# Patient Record
Sex: Female | Born: 1975 | Race: White | Hispanic: No | Marital: Single | Smoking: Current every day smoker
Health system: Southern US, Community
[De-identification: ages and names within clinical notes are randomized; demographics above are authoritative.]

## PROBLEM LIST (undated history)

## (undated) DIAGNOSIS — K769 Liver disease, unspecified: Secondary | ICD-10-CM

## (undated) DIAGNOSIS — F1910 Other psychoactive substance abuse, uncomplicated: Secondary | ICD-10-CM

## (undated) DIAGNOSIS — Z720 Tobacco use: Secondary | ICD-10-CM

## (undated) DIAGNOSIS — M419 Scoliosis, unspecified: Secondary | ICD-10-CM

## (undated) DIAGNOSIS — B1920 Unspecified viral hepatitis C without hepatic coma: Secondary | ICD-10-CM

---

## 2013-05-27 ENCOUNTER — Emergency Department (HOSPITAL_COMMUNITY): Payer: Medicaid Other

## 2013-05-27 ENCOUNTER — Inpatient Hospital Stay (HOSPITAL_COMMUNITY)
Admission: EM | Admit: 2013-05-27 | Discharge: 2013-05-31 | DRG: 442 | Disposition: A | Discharge status: Home / Self Care | Payer: Medicaid Other | Attending: Internal Medicine | Admitting: Internal Medicine

## 2013-05-27 ENCOUNTER — Encounter (HOSPITAL_COMMUNITY): Payer: Self-pay | Admitting: *Deleted

## 2013-05-27 ENCOUNTER — Inpatient Hospital Stay (HOSPITAL_COMMUNITY): Payer: Medicaid Other

## 2013-05-27 DIAGNOSIS — K7200 Acute and subacute hepatic failure without coma: Secondary | ICD-10-CM

## 2013-05-27 DIAGNOSIS — B179 Acute viral hepatitis, unspecified: Secondary | ICD-10-CM | POA: Diagnosis present

## 2013-05-27 DIAGNOSIS — B1920 Unspecified viral hepatitis C without hepatic coma: Secondary | ICD-10-CM | POA: Diagnosis present

## 2013-05-27 DIAGNOSIS — F1910 Other psychoactive substance abuse, uncomplicated: Secondary | ICD-10-CM | POA: Diagnosis present

## 2013-05-27 DIAGNOSIS — D689 Coagulation defect, unspecified: Secondary | ICD-10-CM | POA: Diagnosis present

## 2013-05-27 DIAGNOSIS — M4120 Other idiopathic scoliosis, site unspecified: Secondary | ICD-10-CM | POA: Diagnosis present

## 2013-05-27 DIAGNOSIS — K5900 Constipation, unspecified: Secondary | ICD-10-CM | POA: Diagnosis present

## 2013-05-27 DIAGNOSIS — F411 Generalized anxiety disorder: Secondary | ICD-10-CM | POA: Diagnosis present

## 2013-05-27 DIAGNOSIS — R17 Unspecified jaundice: Secondary | ICD-10-CM

## 2013-05-27 DIAGNOSIS — F17200 Nicotine dependence, unspecified, uncomplicated: Secondary | ICD-10-CM | POA: Diagnosis present

## 2013-05-27 HISTORY — DX: Tobacco use: Z72.0

## 2013-05-27 HISTORY — DX: Other psychoactive substance abuse, uncomplicated: F19.10

## 2013-05-27 HISTORY — DX: Liver disease, unspecified: K76.9

## 2013-05-27 HISTORY — DX: Scoliosis, unspecified: M41.9

## 2013-05-27 LAB — COMPREHENSIVE METABOLIC PANEL
ALT: 1471 U/L — ABNORMAL HIGH (ref 0–35)
Albumin: 3 g/dL — ABNORMAL LOW (ref 3.5–5.2)
BUN: 7 mg/dL (ref 6–23)
Calcium: 9.3 mg/dL (ref 8.4–10.5)
GFR calc Af Amer: >90 mL/min (ref >90)
Glucose, Bld: 86 mg/dL (ref 70–99)
Potassium: 3.2 mEq/L — ABNORMAL LOW (ref 3.5–5.1)
Sodium: 137 mEq/L (ref 135–145)
Total Protein: 5.8 g/dL — ABNORMAL LOW (ref 6.0–8.3)

## 2013-05-27 LAB — RAPID URINE DRUG SCREEN, HOSP PERFORMED
Cocaine: POSITIVE — AB
Opiates: POSITIVE — AB

## 2013-05-27 LAB — CBC WITH DIFFERENTIAL/PLATELET
Basophils Relative: 1 % (ref 0–1)
Eosinophils Absolute: 0.3 10*3/uL (ref 0.0–0.7)
Eosinophils Relative: 5 % (ref 0–5)
Lymphs Abs: 1.8 10*3/uL (ref 0.7–4.0)
MCH: 29.1 pg (ref 26.0–34.0)
MCHC: 36 g/dL (ref 30.0–36.0)
MCV: 80.9 fL (ref 78.0–100.0)
Neutrophils Relative %: 57 % (ref 43–77)
Platelets: 288 10*3/uL (ref 150–400)
RBC: 4.91 MIL/uL (ref 3.87–5.11)

## 2013-05-27 LAB — URINALYSIS, ROUTINE W REFLEX MICROSCOPIC
Glucose, UA: NEGATIVE mg/dL
Nitrite: POSITIVE — AB
Specific Gravity, Urine: 1.019 (ref 1.005–1.030)
pH: 5.5 (ref 5.0–8.0)

## 2013-05-27 LAB — PHOSPHORUS: Phosphorus: 3.2 mg/dL (ref 2.3–4.6)

## 2013-05-27 LAB — POCT PREGNANCY, URINE: Preg Test, Ur: NEGATIVE

## 2013-05-27 LAB — URINE MICROSCOPIC-ADD ON

## 2013-05-27 LAB — ETHANOL: Alcohol, Ethyl (B): <11 mg/dL (ref 0–11)

## 2013-05-27 LAB — APTT: aPTT: 44 seconds — ABNORMAL HIGH (ref 24–37)

## 2013-05-27 LAB — ACETAMINOPHEN LEVEL: Acetaminophen (Tylenol), Serum: <10 ug/mL — ABNORMAL LOW (ref 10–30)

## 2013-05-27 LAB — RAPID HIV SCREEN (WH-MAU): Rapid HIV Screen: NONREACTIVE

## 2013-05-27 MED ORDER — POTASSIUM CHLORIDE CRYS ER 20 MEQ PO TBCR
40.0000 meq | EXTENDED_RELEASE_TABLET | Freq: Once | ORAL | Status: AC
  Administered 2013-05-27: 40 meq via ORAL
  Filled 2013-05-27: qty 2

## 2013-05-27 MED ORDER — MORPHINE SULFATE 4 MG/ML IJ SOLN: 4.0000 mg | Freq: Once | INTRAMUSCULAR | Status: DC

## 2013-05-27 MED ORDER — ONDANSETRON HCL 4 MG PO TABS: 4.0000 mg | ORAL_TABLET | Freq: Four times a day (QID) | ORAL | Status: DC | PRN

## 2013-05-27 MED ORDER — IOHEXOL 300 MG/ML  SOLN
80.0000 mL | Freq: Once | INTRAMUSCULAR | Status: AC | PRN
  Administered 2013-05-27: 80 mL via INTRAVENOUS

## 2013-05-27 MED ORDER — HEPARIN SODIUM (PORCINE) 5000 UNIT/ML IJ SOLN
5000.0000 [IU] | Freq: Three times a day (TID) | INTRAMUSCULAR | Status: DC
  Administered 2013-05-27 – 2013-05-31 (×10): 5000 [IU] via SUBCUTANEOUS
  Filled 2013-05-27 (×16): qty 1

## 2013-05-27 MED ORDER — SODIUM CHLORIDE 0.9 % IV BOLUS (SEPSIS)
1000.0000 mL | Freq: Once | INTRAVENOUS | Status: AC
  Administered 2013-05-27: 1000 mL via INTRAVENOUS

## 2013-05-27 MED ORDER — DOCUSATE SODIUM 100 MG PO CAPS
100.0000 mg | ORAL_CAPSULE | Freq: Two times a day (BID) | ORAL | Status: DC
  Administered 2013-05-27 – 2013-05-31 (×9): 100 mg via ORAL
  Filled 2013-05-27 (×9): qty 1

## 2013-05-27 MED ORDER — ALPRAZOLAM 0.25 MG PO TABS
0.2500 mg | ORAL_TABLET | Freq: Three times a day (TID) | ORAL | Status: DC | PRN
  Administered 2013-05-28 – 2013-05-30 (×5): 0.25 mg via ORAL
  Filled 2013-05-27 (×5): qty 1

## 2013-05-27 MED ORDER — IOHEXOL 300 MG/ML  SOLN
25.0000 mL | INTRAMUSCULAR | Status: AC
  Administered 2013-05-27: 25 mL via ORAL

## 2013-05-27 MED ORDER — DESOGESTREL-ETHINYL ESTRADIOL 0.15-0.02/0.01 MG (21/5) PO TABS: 1.0000 | ORAL_TABLET | Freq: Every day | ORAL | Status: DC

## 2013-05-27 MED ORDER — OXYCODONE HCL 5 MG PO TABS
5.0000 mg | ORAL_TABLET | ORAL | Status: DC | PRN
  Administered 2013-05-27 – 2013-05-31 (×15): 5 mg via ORAL
  Filled 2013-05-27 (×16): qty 1

## 2013-05-27 NOTE — ED Provider Notes (Signed)
CSN: [id number]     Arrival date & time 05/27/13  9147 History     First MD Initiated Contact with Patient 05/27/13 [phone]     No chief complaint on file.  (Consider location/radiation/quality/duration/timing/severity/associated sxs/prior Treatment) Patient is a 37 y.o. female presenting with abdominal pain. The history is provided by the patient.  Abdominal Pain Pain location:  Generalized Pain quality: aching   Pain radiates to:  Back Pain severity:  Severe Onset quality:  Gradual Duration:  2 days Timing:  Constant Progression:  Unchanged Chronicity: acute on chronic. Context: eating   Context: not alcohol use, not retching and not sick contacts   Relieved by:  None tried Worsened by:  Eating Ineffective treatments:  None tried Associated symptoms: constipation, nausea and vomiting   Associated symptoms: no anorexia, no chest pain, no chills, no cough, no diarrhea, no dysuria, no fatigue, no fever, no hematemesis, no hematochezia, no hematuria, no melena and no shortness of breath   Nausea:    Severity:  Moderate   Onset quality:  Gradual   Duration:  2 days   Timing:  Constant   Progression:  Unchanged Vomiting:    Quality:  Bilious material   Number of occurrences:  Multiple   Severity:  Moderate   Duration:  2 days   Timing:  Constant   Progression:  Unchanged Risk factors comment:  H/o enlarged liver   Past Medical History  Diagnosis Date  . Liver disease     Diagnosed in 2014, no work-up to date  . Scoliosis     Per patient report, with chronic back pain, on chronic opioids  . Tobacco abuse   . IV drug abuse    Past Surgical History  Procedure Laterality Date  . Cesarean section     History reviewed. No pertinent family history. History  Substance Use Topics  . Smoking status: Current Every Day Smoker -- 1.00 packs/day for 25 years    Types: Cigarettes  . Smokeless tobacco: Never Used  . Alcohol Use: No     Comment: History of binge drinking, quit  in 2002 when she became pregnant   OB History   Grav Para Term Preterm Abortions TAB SAB Ect Mult Living                 Review of Systems  Constitutional: Negative for fever, chills, diaphoresis, appetite change and fatigue.  Eyes: Negative for photophobia and visual disturbance.  Respiratory: Negative for cough, chest tightness, shortness of breath and wheezing.   Cardiovascular: Negative for chest pain, palpitations and leg swelling.  Gastrointestinal: Positive for nausea, vomiting, abdominal pain and constipation. Negative for diarrhea, blood in stool, melena, hematochezia, abdominal distention, anorexia and hematemesis.  Genitourinary: Negative for dysuria, urgency, frequency, hematuria, flank pain, decreased urine volume and difficulty urinating.  Skin: Positive for color change (jaundice).  Neurological: Negative for dizziness, syncope, weakness, light-headedness, numbness and headaches.  All other systems reviewed and are negative.    Allergies  Review of patient's allergies indicates no known allergies.  Home Medications  No current outpatient prescriptions on file. BP 107/81  Pulse 63  Temp(Src) 98.1 F (36.7 C) (Oral)  Resp 16  SpO2 100%  LMP 05/20/2013 Physical Exam  Nursing note and vitals reviewed. Constitutional: She is oriented to person, place, and time. She appears well-developed and well-nourished. No distress.  HENT:  Head: Normocephalic and atraumatic.  Eyes: EOM are normal. Pupils are equal, round, and reactive to light. Scleral icterus is present.  Neck: Normal range of motion. Neck supple.  Cardiovascular: Normal rate, regular rhythm, normal heart sounds and intact distal pulses.   Pulmonary/Chest: Effort normal and breath sounds normal. No respiratory distress. She has no wheezes. She has no rales.  Abdominal: Soft. Normal appearance and bowel sounds are normal. She exhibits no distension, no fluid wave, no ascites and no mass. There is generalized  tenderness. There is no rigidity, no rebound, no guarding, no CVA tenderness, no tenderness at McBurney's point and negative Murphy's sign.  Musculoskeletal: Normal range of motion. She exhibits no edema and no tenderness.  Neurological: She is alert and oriented to person, place, and time. She exhibits normal muscle tone. Coordination normal.  Skin: Skin is warm and dry. She is not diaphoretic.  Jaundice    ED Course   Procedures (including critical care time)  Labs Reviewed  CBC WITH DIFFERENTIAL - Abnormal; Notable for the following:    RDW 17.8 (*)    All other components within normal limits  COMPREHENSIVE METABOLIC PANEL - Abnormal; Notable for the following:    Potassium 3.2 (*)    Total Protein 5.8 (*)    Albumin 3.0 (*)    AST 2148 (*)    ALT 1471 (*)    Alkaline Phosphatase 254 (*)    Total Bilirubin 32.0 (*)    All other components within normal limits  URINALYSIS, ROUTINE W REFLEX MICROSCOPIC - Abnormal; Notable for the following:    Color, Urine AMBER (*)    APPearance CLOUDY (*)    Bilirubin Urine LARGE (*)    Ketones, ur 15 (*)    Nitrite POSITIVE (*)    Leukocytes, UA SMALL (*)    All other components within normal limits  APTT - Abnormal; Notable for the following:    aPTT 44 (*)    All other components within normal limits  URINE RAPID DRUG SCREEN (HOSP PERFORMED) - Abnormal; Notable for the following:    Opiates POSITIVE (*)    Cocaine POSITIVE (*)    Benzodiazepines POSITIVE (*)    Tetrahydrocannabinol POSITIVE (*)    All other components within normal limits  PROTIME-INR - Abnormal; Notable for the following:    Prothrombin Time 18.0 (*)    INR 1.53 (*)    All other components within normal limits  URINE MICROSCOPIC-ADD ON - Abnormal; Notable for the following:    Squamous Epithelial / LPF FEW (*)    Bacteria, UA MANY (*)    Casts GRANULAR CAST (*)    All other components within normal limits  HEPATIC FUNCTION PANEL - Abnormal; Notable for the  following:    Total Protein 5.2 (*)    Albumin 2.6 (*)    AST 1926 (*)    ALT 1286 (*)    Alkaline Phosphatase 227 (*)    Total Bilirubin 27.8 (*)    Bilirubin, Direct 18.5 (*)    Indirect Bilirubin 9.3 (*)    All other components within normal limits  URINE CULTURE  LIPASE, BLOOD  AMMONIA  ETHANOL  RAPID HIV SCREEN ([facility])  MAGNESIUM  PHOSPHORUS  HEPATITIS PANEL, ACUTE  ACETAMINOPHEN LEVEL  POCT PREGNANCY, URINE   Ct Abdomen Pelvis W Contrast  05/27/2013   *RADIOLOGY REPORT*  Clinical Data: Jaundiced.  Diffuse abdominal pain with vomiting.  CT ABDOMEN AND PELVIS WITH CONTRAST  Technique:  Multidetector CT imaging of the abdomen and pelvis was performed following the standard protocol during bolus administration of intravenous contrast.  Contrast: 80mL OMNIPAQUE IOHEXOL 300  MG/ML  SOLN  Comparison: CT and ultrasound 01/22/2013.  Findings: Lung bases show dependent atelectasis bilaterally.  Heart size normal.  No pericardial or pleural fluid.  Liver is heterogeneous, as before.  Periportal edema.  Gallbladder wall is thickened with prominent vessels surrounding it, similar to the prior exam.  Extrahepatic bile duct is not dilated, measuring approximately 5 mm.  Adrenal glands, kidneys, spleen, pancreas, stomach and bowel are unremarkable.  Ascites.  No free air.  There may be a borderline enlarged 10 mm gastrohepatic ligament lymph node.  Porta hepatis lymph node also appears prominent, measuring 1.3 cm.  No worrisome lytic or sclerotic lesions. 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  IMPRESSION:  1.  Diffuse heterogeneity of the liver parenchyma and periportal edema, similar to 01/22/2013. 2.  Marked gallbladder wall thickening, as on the prior exam. 3.  New ascites.   Original Report Authenticated By: Leanna Battles, M.D.   Dg Abd Acute W/chest  05/27/2013   *RADIOLOGY REPORT*  Clinical Data: Liver disease, abdominal pain.  ACUTE ABDOMEN  SERIES (ABDOMEN 2 VIEW & CHEST 1 VIEW)  Comparison: 12/15/2012.  Findings: Trachea is midline.  Heart size normal.  Nipple shadows project over the lower hemithoraces.  Lungs are clear.  Question trace left pleural effusion.  Two views of the abdomen show gas and stool in the colon.  No small bowel dilatation.  There may be a few scattered air fluid levels.  IMPRESSION:  1.  Bowel gas pattern is somewhat nonspecific.  Constipation is queried. 2.  Suspect tiny left pleural effusion.   Original Report Authenticated By: Leanna Battles, M.D.   1. Jaundice   2. Acute liver failure   3. Polysubstance abuse     MDM  37 y.o. F with a PMH of "enlarged liver" presenting with abdominal pain.  Pt reports diffuse abdominal pain which has gotten acutely worse over past 2 days.  She states she has developed jaundice of her eyes which is new for her.  She reports associated vomiting which occurs following eating.  States vomitus is bilious.  Pt denies fevers.  No diarrhea but does report constipation recently with BMs every 2-3 days.  No dysuria but she reports orange-colored urine.  Pt states she believes her liver disease is 2/2 tylenol use. She was told at 2 different EDs that she has an enlarged liver but has not followed up to get this further evaluated. She denies alcohol abuse.  She does endorse IV drug abuse over the past month due to pain.  On exam, pt afebrile, VSS.  Pt jaundiced with scleral icterus.  Abdomen soft, diffusely TTP, no rebound or guarding.  No fluid wave or ascites on exam.  Will check basic labs, coags, HIV and hepatitis panel, ammonia, lipase, U/A.  Acute abdominal series ordered to r/o SBO.  Evidence of acute liver failure with AST of 2148, ALT of 1471, tBili of 32.  Coagulopathy present with INR of 1.58.  Plan for admission for further GI workup.  Discussed with attending Dr. Lynelle Doctor.  Jodean Lima, MD 05/27/13 [phone]

## 2013-05-27 NOTE — ED Notes (Signed)
Dr notified of lab results

## 2013-05-27 NOTE — ED Notes (Signed)
Pt up in room, going through bags, slowly, speech slurred, when asked about drug use-- admits to injecting OPANA last week-- has old track marks on right antecubital.

## 2013-05-27 NOTE — ED Notes (Signed)
States "I don't want anyone to know anything -- if my parents come in-- do not tell them anything. I am a single parent, have two kids-- a 79 and 38 yr old. They are staying with my parents" also asking "No one is going to call social services are they? I am a good mom" --

## 2013-05-27 NOTE — ED Notes (Signed)
Sitting in bed, cross legged, coloring with gel pens, talking on phone,

## 2013-05-27 NOTE — ED Provider Notes (Signed)
I saw and evaluated the patient, reviewed the resident's note and I agree with the findings and plan.  Patient presents with worsening abdominal pain she states it started back in December. Patient has been to 2 different emergency rooms and was told that her liver was enlarged. The patient states that she did not followup with anyone else in the interim. The symptoms have progressed. The patient has now noticed that she's having dark discoloration of her skin in her eyes appear yellow. She continues to have nausea and vomiting as well as pain in her abdomen and raised her back. Patient denies any alcohol use. She does admit to IV drug abuse but that was only in the last month to help control her pain.  On exam patient is thin jaundiced, vital signs are stable.  We will plan on checking laboratory tests including an HIV and hepatitis panel. Patient will likely require abdominal imaging.  Plan on IV fluids, pain medications.  Celene Kras, MD 05/27/13 [phone]

## 2013-05-27 NOTE — ED Notes (Signed)
States has been sitting in car all night in valet parking lot-was too weak to get into the hospital. "A nurse coming into work woke me up and I got out and walked around, got back in, fell asleep again"

## 2013-05-27 NOTE — ED Notes (Signed)
Speech slurred, eyes half open, nodding off in middle conversation.

## 2013-05-27 NOTE — Progress Notes (Signed)
Received patient from ED , arousable, slow to respond, VSS, significant other with the patient during the admission. Plan of care discussed to patient and significant other. No questions verbalized, oriented to room,call light and staff. Bed alarm on

## 2013-05-27 NOTE — Progress Notes (Signed)
MD made aware of the bilirubin level. No new order noted.

## 2013-05-27 NOTE — ED Notes (Signed)
Returned from [employer] -- found up in room, "looking for tampon" -- ambulated to BR with assistance-- instructed to stay in the bed and use call bell.

## 2013-05-27 NOTE — ED Notes (Signed)
Pt has a liver problem, pt appears jaundiced.  Pt is vomiting and having abdominal pain and pain in back

## 2013-05-27 NOTE — ED Notes (Signed)
Pt has markers, word search book, and duct tape at bedside-- "i am going to make something pretty.Marland KitchenMarland Kitchen"

## 2013-05-27 NOTE — H&P (Signed)
Date: 05/27/2013               Patient Name:  Vicki Conrad MRN: [id number]  DOB: 1976/07/04 Age / Sex: 37 y.o., female   PCP: Dr. Drucie Opitz, [place]         Medical Service: Internal Medicine Teaching Service         Attending Physician: Dr. Margarito Liner    First Contact: Dr. Yetta Barre Pager: [phone]  Second Contact: Dr. Clyde Lundborg Pager: [phone]       After Hours (After 5p/  First Contact Pager: [phone]  weekends / holidays): Second Contact Pager: [phone]   Chief Complaint: Jaundice, abd pain  History of Present Illness:  The patient is a 37 yo woman, history of IV drug use, presenting with jaundice and abdominal pain.  The patient notes an 8 month history of abdominal pain, described as a sharp, epigastric pain, which radiates to the rest of her abdomen diffusely.  The pain "comes and goes", and may last for days at a time, though with some pain-free days.  The pain is around its baseline.  Associated symptoms include nausea for the last week, with only 1 episode of non-bloody vomiting (yesterday), and constipation.  The patient also noticed yellowing of her skin for the last 1 week, and scleral icterus for the last 4 days.  To manage the pain, the patient admits to taking oxycodone 30 mg TID, and injecting IV hydromorphone, as well as injecting "anything I can get my hands on", with only some relief.  The patient states she has sought care for this at [facility] and [facility], and was told she had an "enlarged liver", but did not stay for admission.  The patient notes no tylenol use (except as part of a friend's percocet, which she occasionally takes) since 09/2012.  Meds: Current Outpatient Prescriptions  Medication Sig Dispense Refill  . ALPRAZolam (XANAX) 1 MG tablet Take 1 mg by mouth 3 (three) times daily.      Marland Kitchen desogestrel-ethinyl estradiol (KARIVA,AZURETTE,MIRCETTE) 0.15-0.02/0.01 MG (21/5) tablet Take 1 tablet by mouth daily.      Marland Kitchen loratadine (CLARITIN) 10 MG  tablet Take 10 mg by mouth 2 (two) times daily.      Marland Kitchen oxycodone (ROXICODONE) 30 MG immediate release tablet Take 30 mg by mouth 3 (three) times daily as needed for pain.        Allergies: Allergies as of 05/27/2013  . (No Known Allergies)   Past Medical History  Diagnosis Date  . Liver disease     Diagnosed in 2014, no work-up to date  . Scoliosis     Per patient report, with chronic back pain, on chronic opioids  . Tobacco abuse   . IV drug abuse    Past Surgical History  Procedure Laterality Date  . Cesarean section     No family history on file. History   Social History  . Marital Status: Single    Spouse Name: N/A    Number of Children: N/A  . Years of Education: N/A   Occupational History  . Not on file.   Social History Main Topics  . Smoking status: Current Every Day Smoker -- 1.00 packs/day for 25 years  . Smokeless tobacco: Not on file  . Alcohol Use: No     Comment: History of binge drinking, quit in 2002 when she became pregnant  . Drug Use: Yes     Comment: Cocaine, marijuana  . Sexual Activity: Not  on file   Other Topics Concern  . Not on file   Social History Narrative   The patient works as a Engineer, maintenance (IT).  The patient has 2 children, age 41 and 24.    Review of Systems: General: no fevers, chills, changes in weight, changes in appetite Skin: no rash HEENT: no blurry vision, hearing changes, sore throat Pulm: no dyspnea, coughing, wheezing CV: no chest pain, palpitations, shortness of breath Abd: see HPI GU: no dysuria, hematuria, polyuria Ext: no arthralgias, myalgias Neuro: no weakness, numbness, or tingling  Physical Exam: Blood pressure 112/75, pulse 77, temperature 97.5 F (36.4 C), temperature source Oral, resp. rate 18, SpO2 97.00%. General: alert, cooperative, appears sad HEENT: +bilateral scleral icterus, PERRL, EOMI, oropharynx clear and non-erythematous Neck: supple Lungs: clear to ascultation bilaterally, normal work  of respiration, no wheezes, rales, ronchi Heart: regular rate and rhythm, no murmurs, gallops, or rubs Abdomen: flat, diffusely moderately ttp, maximally at RUQ, non-distended, no rebound tenderness, normoactive bowel sounds, examination limited by voluntary guarding Extremities: no cyanosis, clubbing, or edema Neurologic: alert & oriented X3, cranial nerves II-XII intact, strength grossly intact, sensation intact to light touch  Lab results: Basic Metabolic Panel:  Recent Labs  52/84/13 0924  NA 137  K 3.2*  CL 99  CO2 27  GLUCOSE 86  BUN 7  CREATININE 0.81  CALCIUM 9.3   Liver Function Tests:  Recent Labs  05/27/13 0924  AST 2148*  ALT 1471*  ALKPHOS 254*  BILITOT 32.0*  PROT 5.8*  ALBUMIN 3.0*    Recent Labs  05/27/13 0924  LIPASE 26    Recent Labs  05/27/13 0924  AMMONIA UNABLE TO DETERMINE DUE TO ICTERUS   CBC:  Recent Labs  05/27/13 0924  WBC 6.5  NEUTROABS 3.7  HGB 14.3  HCT 39.7  MCV 80.9  PLT 288   Coagulation:  Recent Labs  05/27/13 0924  LABPROT 18.0*  INR 1.53*   Urine Drug Screen: Drugs of Abuse     Component Value Date/Time   LABOPIA POSITIVE* 05/27/2013 1100   COCAINSCRNUR POSITIVE* 05/27/2013 1100   LABBENZ POSITIVE* 05/27/2013 1100   AMPHETMU NONE DETECTED 05/27/2013 1100   THCU POSITIVE* 05/27/2013 1100   LABBARB NONE DETECTED 05/27/2013 1100      Alcohol Level:  Recent Labs  05/27/13 0924  ETH <11   Urine pregnancy test: Negative  Urinalysis:  Recent Labs  05/27/13 1100  COLORURINE AMBER*  LABSPEC 1.019  PHURINE 5.5  GLUCOSEU NEGATIVE  HGBUR NEGATIVE  BILIRUBINUR LARGE*  KETONESUR 15*  PROTEINUR NEGATIVE  UROBILINOGEN 1.0  NITRITE POSITIVE*  LEUKOCYTESUR SMALL*    Imaging results:  Dg Abd Acute W/chest  05/27/2013   *RADIOLOGY REPORT*  Clinical Data: Liver disease, abdominal pain.  ACUTE ABDOMEN SERIES (ABDOMEN 2 VIEW & CHEST 1 VIEW)  Comparison: 12/15/2012.  Findings: Trachea is midline.  Heart  size normal.  Nipple shadows project over the lower hemithoraces.  Lungs are clear.  Question trace left pleural effusion.  Two views of the abdomen show gas and stool in the colon.  No small bowel dilatation.  There may be a few scattered air fluid levels.  IMPRESSION:  1.  Bowel gas pattern is somewhat nonspecific.  Constipation is queried. 2.  Suspect tiny left pleural effusion.   Original Report Authenticated By: Leanna Battles, M.D.    Assessment & Plan by Problem: The patient is a 37 yo woman, history of IV drug use, presenting with acute liver failure.  #  Acute Hepatitis - The patient presents with elevated AST, ALT, alk phos, and bilirubin, concerning for acute liver injury.  Given history of IV drug use, I'm concerned about acute viral hepatitis.  Other possibilities include autoimmune hepatitis (though no history of autoimmune disease) vs acetaminophen overdose (pt denies tylenol usage) vs alcoholic hepatitis (though pt denies recent alcohol use).  Less likely possibilities include liver mass vs cholecystitis vs PBC.  Pt may also have a component of chronic liver disease, given prior alcohol use. -checking HIV, hepatitis panel, tylenol level -ordered CT abd/pelvis -if above studies unrevealing, can consider work-up for more rare disease (autoimmune, Wilson's disease, etc.)  -avoid hepatotoxic medications -trend LFT's -will attempt to obtain outside records for comparison -will give oxycodone 5 mg q4prn for pain.  Will need to be cautious with pain medications, due to acute liver injury  # Anxiety -will decrease xanax to 0.25 mg TID prn, given acute liver injury  # Prophy - heparin  Dispo: Disposition is deferred at this time, awaiting improvement of current medical problems. Anticipated discharge in approximately 2-3 day(s).   The patient does have a current PCP (Dr. Drucie Opitz) and does not need an [facility] hospital follow-up appointment after discharge.  Signed: Linward Headland,  MD 05/27/2013, 11:48 AM

## 2013-05-28 LAB — HEPATITIS PANEL, ACUTE
HCV Ab: REACTIVE — AB
Hep A IgM: NEGATIVE
Hepatitis B Surface Ag: NEGATIVE

## 2013-05-28 LAB — HEPATIC FUNCTION PANEL
ALT: 1286 U/L — ABNORMAL HIGH (ref 0–35)
ALT: 1075 U/L — ABNORMAL HIGH (ref 0–35)
AST: 1716 U/L — ABNORMAL HIGH (ref 0–37)
Albumin: 2.3 g/dL — ABNORMAL LOW (ref 3.5–5.2)
Bilirubin, Direct: 21.9 mg/dL — ABNORMAL HIGH (ref 0.0–0.3)
Bilirubin, Direct: 19.5 mg/dL — ABNORMAL HIGH (ref 0.0–0.3)
Indirect Bilirubin: 9.3 mg/dL — ABNORMAL HIGH (ref 0.3–0.9)
Total Protein: 5.2 g/dL — ABNORMAL LOW (ref 6.0–8.3)
Total Protein: 4.7 g/dL — ABNORMAL LOW (ref 6.0–8.3)

## 2013-05-28 LAB — CBC
MCV: 79 fL (ref 78.0–100.0)
Platelets: 230 10*3/uL (ref 150–400)
RBC: 5.2 MIL/uL — ABNORMAL HIGH (ref 3.87–5.11)
RDW: 17.5 % — ABNORMAL HIGH (ref 11.5–15.5)
WBC: 8.9 10*3/uL (ref 4.0–10.5)

## 2013-05-28 LAB — BASIC METABOLIC PANEL
CO2: 24 mEq/L (ref 19–32)
Calcium: 8.6 mg/dL (ref 8.4–10.5)
Creatinine, Ser: 0.79 mg/dL (ref 0.50–1.10)
GFR calc Af Amer: >90 mL/min (ref >90)
GFR calc non Af Amer: >90 mL/min (ref >90)
Sodium: 139 mEq/L (ref 135–145)

## 2013-05-28 NOTE — Progress Notes (Signed)
Patient ID: Vicki Conrad, female   DOB: 06/21/1976, 37 y.o.   MRN: [id number]   Subjective: [name] is a 37 yo woman with a history of anxiety and IV drug use who presents with acute hepatitis.woman with a history of anxiety and IV drug use who presents with acute hepatitis.  This morning she tells me that her abdominal pain is present although unchanged from yesterday. She also states that she has not had a bowel movement in over a week. She states that she has not tried a laxative before. Back pain is at baseline. No dysuria.   At 14:40 we were informed that Vicki Conrad left the hospital and was found in the parking lot with a friend. She then returned to her room.  Objective: Vital signs in last 24 hours: Filed Vitals:   05/27/13 2113 05/28/13 0140 05/28/13 0604 05/28/13 1007  BP: 103/63 97/61 102/63 113/70  Pulse: 65 70 79 65  Temp: 98 F (36.7 C) 98.4 F (36.9 C) 98.3 F (36.8 C) 98.1 F (36.7 C)  TempSrc: Oral Oral Oral Oral  Resp: 17 19 18 16   Height:      Weight:      SpO2: 96% 92% 93% 99%   Weight change:   Intake/Output Summary (Last 24 hours) at 05/28/13 1108 Last data filed at 05/28/13 0610  Gross per 24 hour  Intake      0 ml  Output   1875 ml  Net  -1875 ml   Physical Exam  Constitutional: She appears jaundiced. She appears unhealthy.  tearful  Eyes: Scleral icterus (bilaterally) is present.  Cardiovascular: Normal rate, regular rhythm and normal heart sounds.  Exam reveals no gallop and no friction rub.   No murmur heard. Pulmonary/Chest: Effort normal and breath sounds normal. She has no wheezes. She has no rhonchi.  Abdominal: Bowel sounds are normal. There is tenderness. There is guarding.  Abdominal exam limited by guarding. Unable to assess beyond general tenderness.  Psychiatric: Her mood appears anxious.    Lab Results: CBC    Component Value Date/Time   WBC 8.9 05/28/2013 0500   RBC 5.20* 05/28/2013 0500   HGB 15.2* 05/28/2013 0500   HCT 41.1 05/28/2013 0500   PLT 230 05/28/2013 0500   MCV 79.0 05/28/2013 0500   MCH  29.2 05/28/2013 0500   MCHC 37.0* 05/28/2013 0500   RDW 17.5* 05/28/2013 0500   LYMPHSABS 1.8 05/27/2013 0924   MONOABS 0.6 05/27/2013 0924   EOSABS 0.3 05/27/2013 0924   BASOSABS 0.1 05/27/2013 0924    CMP     Component Value Date/Time   NA 139 05/28/2013 0500   K 3.9 05/28/2013 0500   CL 105 05/28/2013 0500   CO2 24 05/28/2013 0500   GLUCOSE 85 05/28/2013 0500   BUN 5* 05/28/2013 0500   CREATININE 0.79 05/28/2013 0500   CALCIUM 8.6 05/28/2013 0500   PROT 4.7* 05/28/2013 0500   ALBUMIN 2.3* 05/28/2013 0500   AST 1716* 05/28/2013 0500   ALT 1075* 05/28/2013 0500   ALKPHOS 214* 05/28/2013 0500   BILITOT 25.2* 05/28/2013 0500   GFRNONAA >90 05/28/2013 0500   GFRAA >90 05/28/2013 0500     Micro Results: No results found for this or any previous visit (from the past 240 hour(s)). Studies/Results: Ct Abdomen Pelvis W Contrast  05/27/2013   *RADIOLOGY REPORT*  Clinical Data: Jaundiced.  Diffuse abdominal pain with vomiting.  CT ABDOMEN AND PELVIS WITH CONTRAST  Technique:  Multidetector CT imaging of the abdomen and pelvis was performed following the standard protocol during bolus administration of  intravenous contrast.  Contrast: 80mL OMNIPAQUE IOHEXOL 300 MG/ML  SOLN  Comparison: CT and ultrasound 01/22/2013.  Findings: Lung bases show dependent atelectasis bilaterally.  Heart size normal.  No pericardial or pleural fluid.  Liver is heterogeneous, as before.  Periportal edema.  Gallbladder wall is thickened with prominent vessels surrounding it, similar to the prior exam.  Extrahepatic bile duct is not dilated, measuring approximately 5 mm.  Adrenal glands, kidneys, spleen, pancreas, stomach and bowel are unremarkable.  Ascites.  No free air.  There may be a borderline enlarged 10 mm gastrohepatic ligament lymph node.  Porta hepatis lymph node also appears prominent, measuring 1.3 cm.  No worrisome lytic or sclerotic lesions. 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  IMPRESSION:  1.  Diffuse heterogeneity of the liver parenchyma and periportal edema, similar to 01/22/2013. 2.  Marked gallbladder wall thickening, as on the prior exam. 3.  New ascites.   Original Report Authenticated By: Leanna Battles, M.D.   Dg Abd Acute W/chest  05/27/2013   *RADIOLOGY REPORT*  Clinical Data: Liver disease, abdominal pain.  ACUTE ABDOMEN SERIES (ABDOMEN 2 VIEW & CHEST 1 VIEW)  Comparison: 12/15/2012.  Findings: Trachea is midline.  Heart size normal.  Nipple shadows project over the lower hemithoraces.  Lungs are clear.  Question trace left pleural effusion.  Two views of the abdomen show gas and stool in the colon.  No small bowel dilatation.  There may be a few scattered air fluid levels.  IMPRESSION:  1.  Bowel gas pattern is somewhat nonspecific.  Constipation is queried. 2.  Suspect tiny left pleural effusion.   Original Report Authenticated By: Leanna Battles, M.D.   Medications: I have reviewed the patient's current medications. Scheduled Meds:  docusate sodium  100 mg Oral BID   heparin  5,000 Units Subcutaneous Q8H    morphine injection  4 mg Intravenous Once   Continuous Infusions:  PRN Meds:.ALPRAZolam, ondansetron, oxyCODONE Assessment/Plan: Vicki Conrad is a 37 yo woman with a history of anxiety and IV drug use who presents with acute hepatitis.woman with a history of anxiety and IV drug use who presents with acute hepatitis.  **Acute hepatitis The patient has a reactive antibody test for Hepatitis C. Need to follow up with confirmatory HCV RNA testing and genotype. Negative Hepatitis B surface antigen. Hepatitis A IgM pending. LFTs continue to trend down since admission. [facility] and [facility]. At this time it remains unclear whether the patient is presenting with acute or chronic hepatitis C and if there are additional liver insults from other sources. Her transaminases are more elevated than expected for hepatitis C infection alone. Will work up for additional  etiologies. -HCV RNA test and genotype -[facility] consult -ANA, Anti-Smooth Muscle Ab, Anti-Mitochondrial Ab, serum ceruloplasmin, iron studies -Follow up on OSH records -Trend LFTs -Supportive care -Oxycodone 5 mg q 4 hours prn (must be cautious due to liver injury) **Anxiety -Xanax 0.25 mg TID prn -Consider switching benzodiazepine to one metabolized primarily outside of the liver (e.g. lorazepam, oxazepam, or temazepam) **Constipation -docusate sodium 100 mg BID **UTI She is asymptomatic and not pregnant. Patient specifically denies dysuria.  -Follow up urine culture **DVT PPx -Heparin  This is a Psychologist, occupational Note.  The care of the patient was discussed with Dr. Lauris Chroman and the assessment and plan formulated with their assistance.  Please see their attached note for official documentation of the daily encounter.   LOS: 1 day   Minerva Areola  Rinaldo Cloud, MS3 05/28/2013, 5:16 PM

## 2013-05-28 NOTE — H&P (Addendum)
Internal Medicine Attending Admission Note Date: 05/28/2013  Patient name: Vicki Conrad Medical record number: [id number] Date of birth: Feb 13, 1976 Age: 37 y.o. Gender: female  I saw and evaluated the patient. I reviewed the resident's note and I agree with the resident's findings and plan as documented in the resident's note, with the following additional comments.  Chief Complaint(s): Abdominal pain, jaundice  History - key components related to admission: Patient is a 37 year old woman with a history of IV drug use admitted with an 8 month history of abdominal pain and recent jaundice.  Patient was apparently seen both at [facility] and [facility] earlier this year, but the extent of her workup is unclear.  She denies any recent Tylenol use, and she also denies over-the-counter medications or herbal supplements.  She denies alcohol.   Physical Exam - key components related to admission:  Filed Vitals:   05/28/13 0140 05/28/13 0604 05/28/13 1007 05/28/13 1345  BP: 97/61 102/63 113/70 107/62  Pulse: 70 79 65 64  Temp: 98.4 F (36.9 C) 98.3 F (36.8 C) 98.1 F (36.7 C) 98.2 F (36.8 C)  TempSrc: Oral Oral Oral Oral  Resp: 19 18 16 18   Height:      Weight:      SpO2: 92% 93% 99% 99%    General: Alert, oriented Skin: Jaundiced Lungs: Clear Heart: Regular; no extra sounds or murmurs Abdomen: Bowel sounds present; moderate tenderness, greatest in right upper quadrant Extremities: No edema  Lab results:   Basic Metabolic Panel:  Recent Labs  40/98/11 0924 05/27/13 1316 05/28/13 0500  NA 137  --  139  K 3.2*  --  3.9  CL 99  --  105  CO2 27  --  24  GLUCOSE 86  --  85  BUN 7  --  5*  CREATININE 0.81  --  0.79  CALCIUM 9.3  --  8.6  MG  --  1.8  --   PHOS  --  3.2  --    Liver Function Tests:  Recent Labs  05/27/13 1316 05/28/13 0500  AST 1926* 1716*  ALT 1286* 1075*  ALKPHOS 227* 214*  BILITOT 27.8* 25.2*  PROT 5.2* 4.7*  ALBUMIN 2.6*  2.3*    Recent Labs  05/27/13 0924  LIPASE 26    Recent Labs  05/27/13 0924  AMMONIA UNABLE TO DETERMINE DUE TO ICTERUS   CBC:  Recent Labs  05/27/13 0924 05/28/13 0500  WBC 6.5 8.9  NEUTROABS 3.7  --   HGB 14.3 15.2*  HCT 39.7 41.1  MCV 80.9 79.0  PLT 288 230    Coagulation:  Recent Labs  05/27/13 0924  INR 1.53*   Urinalysis    Component Value Date/Time   COLORURINE AMBER* 05/27/2013 1100   APPEARANCEUR CLOUDY* 05/27/2013 1100   LABSPEC 1.019 05/27/2013 1100   PHURINE 5.5 05/27/2013 1100   GLUCOSEU NEGATIVE 05/27/2013 1100   HGBUR NEGATIVE 05/27/2013 1100   BILIRUBINUR LARGE* 05/27/2013 1100   KETONESUR 15* 05/27/2013 1100   PROTEINUR NEGATIVE 05/27/2013 1100   UROBILINOGEN 1.0 05/27/2013 1100   NITRITE POSITIVE* 05/27/2013 1100   LEUKOCYTESUR SMALL* 05/27/2013 1100     Urine microscopic: WBCs 3-6, RBCs 0-2, squamous epithelial few, bacteria many, granular casts   Drugs of Abuse     Component Value Date/Time   LABOPIA POSITIVE* 05/27/2013 1100   COCAINSCRNUR POSITIVE* 05/27/2013 1100   LABBENZ POSITIVE* 05/27/2013 1100   AMPHETMU NONE DETECTED 05/27/2013 1100   THCU POSITIVE* 05/27/2013  1100   LABBARB NONE DETECTED 05/27/2013 1100      Alcohol Level:  Recent Labs  05/27/13 0924  ETH <11   Hepatitis B Surface Ag  Date Value Range Status  05/27/2013 NEGATIVE  NEGATIVE Final     HCV Ab  Date Value Range Status  05/27/2013 Reactive* NEGATIVE Final     (NOTE)                                                                               This test is for screening purposes only.  Reactive results should be     confirmed by an alternative method.  Suggest HCV Qualitative, PCR,     test code 47829.  Specimens will be stable for reflex testing up to 3     days after collection.     Performed at [facility]     Hep A IgM  Date Value Range Status  05/27/2013 PENDING  NEGATIVE Incomplete     Hep B C IgM  Date Value Range Status  05/27/2013  PENDING  NEGATIVE Incomplete      Acetaminophen (Tylenol), Serum  Date Value Range Status  05/27/2013 <10.0* 10 - 30 ug/mL Final     Performed at [facility]     Imaging results:  Ct Abdomen Pelvis W Contrast  05/27/2013   *RADIOLOGY REPORT*  Clinical Data: Jaundiced.  Diffuse abdominal pain with vomiting.  CT ABDOMEN AND PELVIS WITH CONTRAST  Technique:  Multidetector CT imaging of the abdomen and pelvis was performed following the standard protocol during bolus administration of intravenous contrast.  Contrast: 80mL OMNIPAQUE IOHEXOL 300 MG/ML  SOLN  Comparison: CT and ultrasound 01/22/2013.  Findings: Lung bases show dependent atelectasis bilaterally.  Heart size normal.  No pericardial or pleural fluid.  Liver is heterogeneous, as before.  Periportal edema.  Gallbladder wall is thickened with prominent vessels surrounding it, similar to the prior exam.  Extrahepatic bile duct is not dilated, measuring approximately 5 mm.  Adrenal glands, kidneys, spleen, pancreas, stomach and bowel are unremarkable.  Ascites.  No free air.  There may be a borderline enlarged 10 mm gastrohepatic ligament lymph node.  Porta hepatis lymph node also appears prominent, measuring 1.3 cm.  No worrisome lytic or sclerotic lesions. 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  IMPRESSION:  1.  Diffuse heterogeneity of the liver parenchyma and periportal edema, similar to 01/22/2013. 2.  Marked gallbladder wall thickening, as on the prior exam. 3.  New ascites.   Original Report Authenticated By: Leanna Battles, M.D.   Dg Abd Acute W/chest  05/27/2013   *RADIOLOGY REPORT*  Clinical Data: Liver disease, abdominal pain.  ACUTE ABDOMEN SERIES (ABDOMEN 2 VIEW & CHEST 1 VIEW)  Comparison: 12/15/2012.  Findings: Trachea is midline.  Heart size normal.  Nipple shadows project over the lower hemithoraces.  Lungs are clear.  Question trace left pleural effusion.   Two views of the abdomen show gas and stool in the colon.  No small bowel dilatation.  There may be a few scattered air fluid levels.  IMPRESSION:  1.  Bowel gas pattern is somewhat  nonspecific.  Constipation is queried. 2.  Suspect tiny left pleural effusion.   Original Report Authenticated By: Leanna Battles, M.D.     Assessment & Plan by Problem:  1.  Acute hepatitis.  Patient's history suggests a chronic hepatic process given her prior outside workup and prior abdominal CT findings, with acute worsening over past week.  The etiology is unclear; the hepatitis C screen is positive, but the marked transaminase and bilirubin elevation seem atypical for hepatitis C.  The differential includes other viral hepatitis, an autoimmune disorder, or other process; patient denies significant acetaminophen use and this is supported by her nondetectable acetaminophen level.  Plan is await remaining hepatitis serologies; get hepatitis C viral load for confirmation; supportive care; get other labs include ANA, anti-smooth muscle antibodies, anti-microsomal antibodies, immunoglobulin levels, ceruloplasmin, and iron studies; GI consult; request outside records.  2.  Anxiety.  Patient is apparently on chronic Xanax; agree with reducing dose; long-term management of her anxiety is complicated by her polysubstance abuse.  3.  Polysubstance abuse.  Plan is social work consult for counseling; advise rehab.

## 2013-05-28 NOTE — Progress Notes (Signed)
  I have seen and examined the patient myself, and I have reviewed the note by Jerolyn Shin, MS 3 and was present during the interview and physical exam.  Please see my separate H&P for additional findings, assessment, and plan.   Signed: Lars Masson, MD 05/28/2013, 5:42 PM

## 2013-05-28 NOTE — Progress Notes (Addendum)
Patient in the hall looking for her nurse and saw this writer saying I want to talk to you and the MD . You have violated the [employer] policy. Walked with the patient in the room and patient very upset and stated the we have violated HIPAA , took her pills ans sent to the pharmacy.Ask the patient to calm down and this writer asked the patient about her statement and stated that her friend told her that she is here because of her polysubstance abuse use. Patient stated " The little [place] man had violated HIPAA, I want to talk to the MD, the president or whoever , I will call my attorney and I am ready to turn this place upside down" Supervisor made aware, MD made aware of the behavior. No paper noted seen on patients chart regarding her claim of a pill sent to the pharmacy.

## 2013-05-28 NOTE — Progress Notes (Addendum)
Patient earlier noted had 2 visitors in the room, patient very alert and moving around the room, when patients visitor left, this writer rounded and patient sleeping , cannot barely open eyes, holding phone on her ears.Supervisor made aware, MD on call made aware as well.

## 2013-05-28 NOTE — Progress Notes (Signed)
Subjective: [name] is a 37 y.o. female w/ PMHx of of IV drug use, presenting with jaundice and abdominal pain.   Seen this AM at bedside. Still complaining of significant diffuse abdominal pain, not controlled by her pain medications. Tearful during interview because her current state of health is difficult for her. She denies any recent nausea, vomiting, fever, chills, SOB, chest pain, or diarrhea. Claims she has not had a bowel movement in some time. Still with significant jaundice and scleral icterus.  Hepatitis panel today showed HCV Ab +ve. Hep A and B negative.   Objective: Vital signs in last 24 hours: Filed Vitals:   05/28/13 0140 05/28/13 0604 05/28/13 1007 05/28/13 1345  BP: 97/61 102/63 113/70 107/62  Pulse: 70 79 65 64  Temp: 98.4 F (36.9 C) 98.3 F (36.8 C) 98.1 F (36.7 C) 98.2 F (36.8 C)  TempSrc: Oral Oral Oral Oral  Resp: 19 18 16 18   Height:      Weight:      SpO2: 92% 93% 99% 99%   Weight change:   Intake/Output Summary (Last 24 hours) at 05/28/13 1752 Last data filed at 05/28/13 0610  Gross per 24 hour  Intake      0 ml  Output   1675 ml  Net  -1675 ml   Physical Exam: General: Alert, cooperative, and in mild distress d/t pain. Tearful during exam. HEENT: Vision grossly intact, oropharynx clear and non-erythematous. Significant scleral icterus. Neck: Full range of motion without pain, supple, no lymphadenopathy or carotid bruits Lungs: Clear to ascultation bilaterally, normal work of respiration, no wheezes, rales, ronchi Heart: Regular rate and rhythm, no murmurs, gallops, or rubs Abdomen: Soft, tender, non-distended. Bowel sounds present. Significant guarding on palpation w/ tenderness diffusely to light touch. Not able to palpate liver, d/t significant guarding. Extremities: No cyanosis, clubbing, or edema Neurologic: Alert & oriented X3, cranial nerves II-XII intact, strength grossly intact, sensation intact to light touch  Lab  Results: Basic Metabolic Panel:  Recent Labs Lab 05/27/13 0924 05/27/13 1316 05/28/13 0500  NA 137  --  139  K 3.2*  --  3.9  CL 99  --  105  CO2 27  --  24  GLUCOSE 86  --  85  BUN 7  --  5*  CREATININE 0.81  --  0.79  CALCIUM 9.3  --  8.6  MG  --  1.8  --   PHOS  --  3.2  --    Liver Function Tests:  Recent Labs Lab 05/27/13 1316 05/28/13 0500  AST 1926* 1716*  ALT 1286* 1075*  ALKPHOS 227* 214*  BILITOT 27.8* 25.2*  PROT 5.2* 4.7*  ALBUMIN 2.6* 2.3*    Recent Labs Lab 05/27/13 0924  LIPASE 26    Recent Labs Lab 05/27/13 0924  AMMONIA UNABLE TO DETERMINE DUE TO ICTERUS   CBC:  Recent Labs Lab 05/27/13 0924 05/28/13 0500  WBC 6.5 8.9  NEUTROABS 3.7  --   HGB 14.3 15.2*  HCT 39.7 41.1  MCV 80.9 79.0  PLT 288 230   Coagulation:  Recent Labs Lab 05/27/13 0924  LABPROT 18.0*  INR 1.53*   Anemia Panel: No results found for this basename: VITAMINB12, FOLATE, FERRITIN, TIBC, IRON, RETICCTPCT,  in the last 168 hours Urine Drug Screen: Drugs of Abuse     Component Value Date/Time   LABOPIA POSITIVE* 05/27/2013 1100   COCAINSCRNUR POSITIVE* 05/27/2013 1100   LABBENZ POSITIVE* 05/27/2013 1100   AMPHETMU  NONE DETECTED 05/27/2013 1100   THCU POSITIVE* 05/27/2013 1100   LABBARB NONE DETECTED 05/27/2013 1100    Alcohol Level:  Recent Labs Lab 05/27/13 0924  ETH <11   Urinalysis:  Recent Labs Lab 05/27/13 1100  COLORURINE AMBER*  LABSPEC 1.019  PHURINE 5.5  GLUCOSEU NEGATIVE  HGBUR NEGATIVE  BILIRUBINUR LARGE*  KETONESUR 15*  PROTEINUR NEGATIVE  UROBILINOGEN 1.0  NITRITE POSITIVE*  LEUKOCYTESUR SMALL*   Micro Results: Recent Results (from the past 240 hour(s))  URINE CULTURE     Status: None   Collection Time    05/27/13 11:00 AM      Result Value Range Status   Specimen Description URINE, CLEAN CATCH   Final   Special Requests NONE   Final   Culture  Setup Time     Final   Value: 05/27/2013 12:12     Performed at [facility] Count     Final   Value: >=100,000 COLONIES/ML     Performed at [facility]   Culture     Final   Value: ESCHERICHIA COLI     Performed at [facility]   Report Status PENDING   Incomplete   Studies/Results: Ct Abdomen Pelvis W Contrast  05/27/2013   *RADIOLOGY REPORT*  Clinical Data: Jaundiced.  Diffuse abdominal pain with vomiting.  CT ABDOMEN AND PELVIS WITH CONTRAST  Technique:  Multidetector CT imaging of the abdomen and pelvis was performed following the standard protocol during bolus administration of intravenous contrast.  Contrast: 80mL OMNIPAQUE IOHEXOL 300 MG/ML  SOLN  Comparison: CT and ultrasound 01/22/2013.  Findings: Lung bases show dependent atelectasis bilaterally.  Heart size normal.  No pericardial or pleural fluid.  Liver is heterogeneous, as before.  Periportal edema.  Gallbladder wall is thickened with prominent vessels surrounding it, similar to the prior exam.  Extrahepatic bile duct is not dilated, measuring approximately 5 mm.  Adrenal glands, kidneys, spleen, pancreas, stomach and bowel are unremarkable.  Ascites.  No free air.  There may be a borderline enlarged 10 mm gastrohepatic ligament lymph node.  Porta hepatis lymph node also appears prominent, measuring 1.3 cm.  No worrisome lytic or sclerotic lesions. 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  IMPRESSION:  1.  Diffuse heterogeneity of the liver parenchyma and periportal edema, similar to 01/22/2013. 2.  Marked gallbladder wall thickening, as on the prior exam. 3.  New ascites.   Original Report Authenticated By: Leanna Battles, M.D.   Dg Abd Acute W/chest  05/27/2013   *RADIOLOGY REPORT*  Clinical Data: Liver disease, abdominal pain.  ACUTE ABDOMEN SERIES (ABDOMEN 2 VIEW & CHEST 1 VIEW)  Comparison: 12/15/2012.  Findings: Trachea is midline.  Heart size normal.  Nipple shadows project over the lower hemithoraces.  Lungs are clear.   Question trace left pleural effusion.  Two views of the abdomen show gas and stool in the colon.  No small bowel dilatation.  There may be a few scattered air fluid levels.  IMPRESSION:  1.  Bowel gas pattern is somewhat nonspecific.  Constipation is queried. 2.  Suspect tiny left pleural effusion.   Original Report Authenticated By: Leanna Battles, M.D.   Medications: I have reviewed the patient's current medications. Scheduled Meds: . docusate sodium  100 mg Oral BID  . heparin  5,000 Units Subcutaneous Q8H  .  morphine injection  4 mg Intravenous Once   Continuous Infusions:  PRN Meds:.ALPRAZolam,  ondansetron, oxyCODONE  Assessment/Plan:  #Acute Hepatitis-  37 y.o. female w/ PMHx of of IV drug use, presenting with jaundice and abdominal pain.  On admission, AST 2148, ALT 1471, Alk Phos 254, total bili 32. This AM, LFT's were AST 1716, ALT 1075, Alk Phos 214, total bili 25.2. Viral Hepatitis panel +ve for HCV AB. Others negative. Sent for HCV RNA for confirmatory workup. On further research, it is unlikely that a sole Hepatitis C infection can cause such a significant transaminitis.  -GI was consulted for the AM.  -Sent for labs to rule out other concomitant liver pathology; ANA, anti-smooth muscle AB, anti-mitochondrial AB, ceruloplasmin, and Iron panel.  -Sent for health records for other institutions to determine previous health issues. -Still trending LFT's.  -Continue oxycodone prn for pain.  #Anxiety- Continue decreased xanax dose of 0.25 mg TID prn, given acute liver injury   #DVT/PE PPx- heparin  Dispo: Disposition is deferred at this time, awaiting improvement of current medical problems.  Anticipated discharge in approximately 2-3 day(s).   The patient does not have a current PCP (No Pcp Per Patient) and does need an [facility] hospital follow-up appointment after discharge.  The patient does not have transportation limitations that hinder transportation to clinic  appointments.  .Services Needed at time of discharge: Y = Yes, Blank = No PT:   OT:   RN:   Equipment:   Other:     LOS: 1 day   Lars Masson, MD 05/28/2013, 5:52 PM Pager: [phone]

## 2013-05-28 NOTE — Progress Notes (Signed)
Noted patient not in the room, staff stated that a visitor came to visit and asked staff if they could walk out, staff told them that the patient is not allowed to step out of the floor. Security made aware that the patient is not on the unit, Md made aware.

## 2013-05-28 NOTE — Progress Notes (Signed)
Patient back on the floor, Security stated he found them walking in the parking deck. Patient educated that she is not allowed to get out of the unit. Will monitor.

## 2013-05-29 DIAGNOSIS — K7200 Acute and subacute hepatic failure without coma: Principal | ICD-10-CM

## 2013-05-29 DIAGNOSIS — F1910 Other psychoactive substance abuse, uncomplicated: Secondary | ICD-10-CM

## 2013-05-29 LAB — URINE CULTURE

## 2013-05-29 LAB — IRON AND TIBC
Iron: 95 ug/dL (ref 42–135)
Saturation Ratios: 35 % (ref 20–55)
TIBC: 273 ug/dL (ref 250–470)

## 2013-05-29 LAB — CBC
HCT: 39.7 % (ref 36.0–46.0)
Hemoglobin: 13.9 g/dL (ref 12.0–15.0)
MCH: 27.9 pg (ref 26.0–34.0)
MCHC: 35 g/dL (ref 30.0–36.0)
RBC: 4.99 MIL/uL (ref 3.87–5.11)

## 2013-05-29 LAB — HCV RNA QUANT RFLX ULTRA OR GENOTYP: HCV Quantitative: 8485 IU/mL — ABNORMAL HIGH (ref <15)

## 2013-05-29 LAB — COMPREHENSIVE METABOLIC PANEL
ALT: 879 U/L — ABNORMAL HIGH (ref 0–35)
Alkaline Phosphatase: 197 U/L — ABNORMAL HIGH (ref 39–117)
BUN: 6 mg/dL (ref 6–23)
CO2: 27 mEq/L (ref 19–32)
Calcium: 8.4 mg/dL (ref 8.4–10.5)
GFR calc Af Amer: >90 mL/min (ref >90)
GFR calc non Af Amer: >90 mL/min (ref >90)
Glucose, Bld: 87 mg/dL (ref 70–99)
Sodium: 140 mEq/L (ref 135–145)

## 2013-05-29 LAB — PROTIME-INR: Prothrombin Time: 17.4 seconds — ABNORMAL HIGH (ref 11.6–15.2)

## 2013-05-29 MED ORDER — POTASSIUM CHLORIDE CRYS ER 20 MEQ PO TBCR
40.0000 meq | EXTENDED_RELEASE_TABLET | Freq: Once | ORAL | Status: AC
  Administered 2013-05-29: 40 meq via ORAL
  Filled 2013-05-29: qty 2

## 2013-05-29 NOTE — Progress Notes (Signed)
Internal Medicine Attending  Date: 05/29/2013  Patient name: Vicki Conrad Medical record number: [id number] Date of birth: 23-May-1976 Age: 37 y.o. Gender: female  I saw and evaluated the patient, and discussed her care on A.M rounds with housestaff.  See the resident's note by Dr. Yetta Barre for details of clinical findings and plans.

## 2013-05-29 NOTE — Progress Notes (Signed)
Patient ID: Vicki Conrad, female   DOB: 16-Aug-1976, 37 y.o.   MRN: [id number]   Subjective: Vicki Conrad is a 37 yo woman with a history of anxiety and IV drug use who presents with acute hepatitis.  Aside from two visitors, there were no acute events overnight. This morning she was somnolent but otherwise feels the same as yesterday. She states that she had a bowel movement last night.  Objective: Vital signs in last 24 hours: Filed Vitals:   05/28/13 1345 05/28/13 1700 05/28/13 2100 05/29/13 0542  BP: 107/62 110/69 99/67 107/61  Pulse: 64 62 66 80  Temp: 98.2 F (36.8 C) 98 F (36.7 C) 98.3 F (36.8 C) 98.4 F (36.9 C)  TempSrc: Oral Oral Oral Oral  Resp: 18 16 18 18   Height:      Weight:      SpO2: 99% 98% 98% 95%   Weight change:   Intake/Output Summary (Last 24 hours) at 05/29/13 1413 Last data filed at 05/28/13 2100  Gross per 24 hour  Intake      0 ml  Output    400 ml  Net   -400 ml   Physical Exam  Constitutional: She appears jaundiced.  Somnolent, tearful  Eyes: Scleral icterus (bilaterally) is present.  Cardiovascular: Normal rate, regular rhythm and normal heart sounds.  Exam reveals no gallop and no friction rub.   No murmur heard. Pulmonary/Chest: Effort normal and breath sounds normal.  Abdominal: Bowel sounds are normal. She exhibits no distension. There is tenderness (diffusely tender to palpation). There is guarding.  Musculoskeletal:       Right lower leg: She exhibits no edema.       Left lower leg: She exhibits no edema.  Psychiatric: Her mood appears anxious.    Lab Results:  CBC    Component Value Date/Time   WBC 7.4 05/29/2013 0500   RBC 4.99 05/29/2013 0500   HGB 13.9 05/29/2013 0500   HCT 39.7 05/29/2013 0500   PLT 240 05/29/2013 0500   MCV 79.6 05/29/2013 0500   MCH 27.9 05/29/2013 0500   MCHC 35.0 05/29/2013 0500   RDW 18.2* 05/29/2013 0500   LYMPHSABS 1.8 05/27/2013 0924   MONOABS 0.6 05/27/2013 0924   EOSABS 0.3 05/27/2013 0924   BASOSABS 0.1 05/27/2013 0924    CMP     Component Value Date/Time   NA 140 05/29/2013 0500   K 3.4* 05/29/2013 0500   CL 105 05/29/2013 0500   CO2 27 05/29/2013 0500   GLUCOSE 87 05/29/2013 0500   BUN 6 05/29/2013 0500   CREATININE 0.80 05/29/2013 0500   CALCIUM 8.4 05/29/2013 0500   PROT 4.9* 05/29/2013 0500   ALBUMIN 2.3* 05/29/2013 0500   AST 1293* 05/29/2013 0500   ALT 879* 05/29/2013 0500   ALKPHOS 197* 05/29/2013 0500   BILITOT 25.1* 05/29/2013 0500   GFRNONAA >90 05/29/2013 0500   GFRAA >90 05/29/2013 0500   INR    Component Value Date/Time   INR 1.47 05/29/2013 0855   INR 1.53* 05/27/2013 0924   Prothrombin Time:  17.4 (05/29/13) 18.0 (05/27/13)  Iron Studies: Ferritin: 170 Iron: 95 TIBC: 273  ANA: negative  Micro Results: Recent Results (from the past 240 hour(s))  URINE CULTURE     Status: None   Collection Time    05/27/13 11:00 AM      Result Value Range Status   Specimen Description URINE, CLEAN CATCH   Final   Special Requests NONE   Final  Culture  Setup Time     Final   Value: 05/27/2013 12:12     Performed at [facility] Count     Final   Value: >=100,000 COLONIES/ML     Performed at [facility]   Culture     Final   Value: ESCHERICHIA COLI     Performed at [facility]   Report Status 05/29/2013 FINAL   Final   Organism ID, Bacteria ESCHERICHIA COLI   Final   Studies/Results: No results found. Medications: I have reviewed the patient's current medications. Scheduled Meds:  docusate sodium  100 mg Oral BID   heparin  5,000 Units Subcutaneous Q8H    morphine injection  4 mg Intravenous Once   Continuous Infusions:  PRN Meds:.ALPRAZolam, ondansetron, oxyCODONE Assessment/Plan: Principal Problem:   Acute hepatitis Active Problems:   Polysubstance abuse  Vicki Conrad is a 37 yo woman with a history of anxiety and IV drug use who presents with acute hepatitis.   **Acute hepatitis  The patient has a  reactive antibody test for Hepatitis C. Need to follow up with confirmatory HCV RNA testing and genotype. Negative Hepatitis B surface antigen. Hepatitis A IgM pending. LFTs continue to trend down since admission. Medical records have been obtained from [facility] and [facility]. She had elevated AST 133 and ALT 95 and UDS positive for cocaine, benzodiazepines, opiates, oxycodone, THC, and tricyclics at [facility] on 01/21/13. It appears that she left AMA before workup was complete. No transaminitis in March of this year at [facility] where she was seen for gastroenteritis. At this time it remains unclear whether the patient is presenting with acute or chronic hepatitis C and if there are additional liver insults from other sources. Her transaminases are more elevated than expected for hepatitis C infection alone. Will work up for additional etiologies. GI consulted and feel the presentation could be explained by Hepatitis C infection and the abdominal pain caused by liver swelling. Iron studies are normal so hemochromatosis is unlikely. -HCV RNA test and genotype  -Appreciate GI recommendations: follow daily PT as it is a sensitive marker of impending liver failure -Anti-Smooth Muscle Ab, Anti-Mitochondrial Ab, serum ceruloplasmin pending -Follow up on OSH records  -Trend LFTs  -Supportive care  -Oxycodone 5 mg q 4 hours prn (must be cautious due to liver injury)  **Anxiety   -Switch Xanax 0.25 mg TID prn to lorazepam (Ativan) 0.5 mg TID prn to reduce the requirement for liver metabolism -Offer outpatient counseling as a resource **Constipation  -docusate sodium 100 mg BID  **UTI  She is asymptomatic and not pregnant. Patient specifically denies dysuria.  -Follow up urine culture  **DVT PPx  -Heparin   This is a Psychologist, occupational Note.  The care of the patient was discussed with Dr. Lauris Chroman and the assessment and plan formulated with their assistance.   Please see their attached note for official documentation of the daily encounter.   LOS: 2 days   Merrie Roof, MS3 05/29/2013, 3:52 PM

## 2013-05-29 NOTE — Progress Notes (Signed)
Subjective: [name] is a 37 y.o. female w/ PMHx of of IV drug use, presenting with jaundice and abdominal pain.   Seen this AM at bedside. Still with abdominal pain today, but says it is controlled by pain medications. Denies any other issues. Slightly somnolent on exam today.   Hepatitis panel today showed HCV Ab +ve. Hep A and B negative. HCV quant came back at 8485. Working up further as her LFT's are much higher than suspected in pure HCV infection.   Objective: Vital signs in last 24 hours: Filed Vitals:   05/28/13 1345 05/28/13 1700 05/28/13 2100 05/29/13 0542  BP: 107/62 110/69 99/67 107/61  Pulse: 64 62 66 80  Temp: 98.2 F (36.8 C) 98 F (36.7 C) 98.3 F (36.8 C) 98.4 F (36.9 C)  TempSrc: Oral Oral Oral Oral  Resp: 18 16 18 18   Height:      Weight:      SpO2: 99% 98% 98% 95%   Weight change:   Intake/Output Summary (Last 24 hours) at 05/29/13 0729 Last data filed at 05/28/13 2100  Gross per 24 hour  Intake      0 ml  Output    400 ml  Net   -400 ml   Physical Exam: General: Alert, cooperative, and in mild distress d/t pain. Tearful during exam. HEENT: Vision grossly intact, oropharynx clear and non-erythematous. Significant scleral icterus. Neck: Full range of motion without pain, supple, no lymphadenopathy or carotid bruits Lungs: Clear to ascultation bilaterally, normal work of respiration, no wheezes, rales, ronchi Heart: Regular rate and rhythm, no murmurs, gallops, or rubs Abdomen: Soft, tender, non-distended. Bowel sounds present. Significant guarding on palpation w/ tenderness diffusely to light touch. Not able to palpate liver, d/t significant guarding. Less than yesterday, however. Extremities: No cyanosis, clubbing, or edema Neurologic: Alert & oriented X3, cranial nerves II-XII intact, strength grossly intact, sensation intact to light touch  Lab Results: Basic Metabolic Panel:  Recent Labs Lab 05/27/13 0924 05/27/13 1316  05/28/13 0500 05/29/13 0500  NA 137  --  139 140  K 3.2*  --  3.9 3.4*  CL 99  --  105 105  CO2 27  --  24 27  GLUCOSE 86  --  85 87  BUN 7  --  5* 6  CREATININE 0.81  --  0.79 0.80  CALCIUM 9.3  --  8.6 8.4  MG  --  1.8  --   --   PHOS  --  3.2  --   --    Liver Function Tests:  Recent Labs Lab 05/28/13 0500 05/29/13 0500  AST 1716* 1293*  ALT 1075* 879*  ALKPHOS 214* 197*  BILITOT 25.2* 25.1*  PROT 4.7* 4.9*  ALBUMIN 2.3* 2.3*    Recent Labs Lab 05/27/13 0924  LIPASE 26    Recent Labs Lab 05/27/13 0924  AMMONIA UNABLE TO DETERMINE DUE TO ICTERUS   CBC:  Recent Labs Lab 05/27/13 0924 05/28/13 0500 05/29/13 0500  WBC 6.5 8.9 7.4  NEUTROABS 3.7  --   --   HGB 14.3 15.2* 13.9  HCT 39.7 41.1 39.7  MCV 80.9 79.0 79.6  PLT 288 230 240   Coagulation:  Recent Labs Lab 05/27/13 0924  LABPROT 18.0*  INR 1.53*   Anemia Panel:  Recent Labs Lab 05/28/13 1642  FERRITIN 170  TIBC 273  IRON 95   Urine Drug Screen: Drugs of Abuse     Component Value Date/Time   LABOPIA  POSITIVE* 05/27/2013 1100   COCAINSCRNUR POSITIVE* 05/27/2013 1100   LABBENZ POSITIVE* 05/27/2013 1100   AMPHETMU NONE DETECTED 05/27/2013 1100   THCU POSITIVE* 05/27/2013 1100   LABBARB NONE DETECTED 05/27/2013 1100    Alcohol Level:  Recent Labs Lab 05/27/13 0924  ETH <11   Urinalysis:  Recent Labs Lab 05/27/13 1100  COLORURINE AMBER*  LABSPEC 1.019  PHURINE 5.5  GLUCOSEU NEGATIVE  HGBUR NEGATIVE  BILIRUBINUR LARGE*  KETONESUR 15*  PROTEINUR NEGATIVE  UROBILINOGEN 1.0  NITRITE POSITIVE*  LEUKOCYTESUR SMALL*   Micro Results: Recent Results (from the past 240 hour(s))  URINE CULTURE     Status: None   Collection Time    05/27/13 11:00 AM      Result Value Range Status   Specimen Description URINE, CLEAN CATCH   Final   Special Requests NONE   Final   Culture  Setup Time     Final   Value: 05/27/2013 12:12     Performed at [facility] Count      Final   Value: >=100,000 COLONIES/ML     Performed at [facility]   Culture     Final   Value: ESCHERICHIA COLI     Performed at [facility]   Report Status PENDING   Incomplete   Studies/Results: Ct Abdomen Pelvis W Contrast  05/27/2013   *RADIOLOGY REPORT*  Clinical Data: Jaundiced.  Diffuse abdominal pain with vomiting.  CT ABDOMEN AND PELVIS WITH CONTRAST  Technique:  Multidetector CT imaging of the abdomen and pelvis was performed following the standard protocol during bolus administration of intravenous contrast.  Contrast: 80mL OMNIPAQUE IOHEXOL 300 MG/ML  SOLN  Comparison: CT and ultrasound 01/22/2013.  Findings: Lung bases show dependent atelectasis bilaterally.  Heart size normal.  No pericardial or pleural fluid.  Liver is heterogeneous, as before.  Periportal edema.  Gallbladder wall is thickened with prominent vessels surrounding it, similar to the prior exam.  Extrahepatic bile duct is not dilated, measuring approximately 5 mm.  Adrenal glands, kidneys, spleen, pancreas, stomach and bowel are unremarkable.  Ascites.  No free air.  There may be a borderline enlarged 10 mm gastrohepatic ligament lymph node.  Porta hepatis lymph node also appears prominent, measuring 1.3 cm.  No worrisome lytic or sclerotic lesions. 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  IMPRESSION:  1.  Diffuse heterogeneity of the liver parenchyma and periportal edema, similar to 01/22/2013. 2.  Marked gallbladder wall thickening, as on the prior exam. 3.  New ascites.   Original Report Authenticated By: Leanna Battles, M.D.   Dg Abd Acute W/chest  05/27/2013   *RADIOLOGY REPORT*  Clinical Data: Liver disease, abdominal pain.  ACUTE ABDOMEN SERIES (ABDOMEN 2 VIEW & CHEST 1 VIEW)  Comparison: 12/15/2012.  Findings: Trachea is midline.  Heart size normal.  Nipple shadows project over the lower hemithoraces.  Lungs are clear.  Question trace left pleural  effusion.  Two views of the abdomen show gas and stool in the colon.  No small bowel dilatation.  There may be a few scattered air fluid levels.  IMPRESSION:  1.  Bowel gas pattern is somewhat nonspecific.  Constipation is queried. 2.  Suspect tiny left pleural effusion.   Original Report Authenticated By: Leanna Battles, M.D.   Medications: I have reviewed the patient's current medications. Scheduled Meds: . docusate sodium  100 mg Oral BID  . heparin  5,000 Units Subcutaneous  Q8H  .  morphine injection  4 mg Intravenous Once   Continuous Infusions:  PRN Meds:.ALPRAZolam, ondansetron, oxyCODONE  Assessment/Plan:  #Acute Hepatitis-  37 y.o. female w/ PMHx of of IV drug use, presenting with jaundice and abdominal pain.  On admission, AST 2148, ALT 1471, Alk Phos 254, total bili 32. This AM, LFT's were AST 1293, ALT 879, Alk Phos 197, total bili 25.1. Viral Hepatitis panel +ve for HCV AB. HCV quant at 8485. On further research, it is unlikely that a sole Hepatitis C infection can cause such a significant transaminitis.  -GI was consulted for today. Appreciate consult. Sent for labs to rule out other concomitant liver pathology; ANA (negative), anti-smooth muscle AB, anti-mitochondrial AB, ceruloplasmin, and Iron panel (wnl). Others still pending. -Sent for health records for other institutions to determine previous health issues. -Still trending LFT's. Improving. PTT slightly elevated at 17.4, INR 1.47. -Continue oxycodone prn for pain.  #Anxiety- Continue decreased xanax dose of 0.25 mg TID prn, given acute liver injury. Consider switching to Ativan as it is not metabolized by the liver.  #DVT/PE PPx- heparin  Dispo: Disposition is deferred at this time, awaiting improvement of current medical problems.  Anticipated discharge in approximately 2-3 day(s).   The patient does not have a current PCP (No Pcp Per Patient) and does need an [facility] hospital follow-up appointment after discharge.  The  patient does not have transportation limitations that hinder transportation to clinic appointments.  .Services Needed at time of discharge: Y = Yes, Blank = No PT:   OT:   RN:   Equipment:   Other:     LOS: 2 days   Lars Masson, MD 05/29/2013, 7:29 AM Pager: [phone]

## 2013-05-29 NOTE — Progress Notes (Signed)
  I have seen and examined the patient myself, and I have reviewed the note by Jerolyn Shin, MS 3 and was present during the interview and physical exam.  Please see my separate H&P for additional findings, assessment, and plan.   Signed: Lars Masson, MD 05/29/2013, 5:21 PM

## 2013-05-29 NOTE — Consult Note (Signed)
[facility] CONSULT Reason for consult: Acute Hepatitis Referring Physician: Internal Medicine Teaching Service  Vicki Conrad is an 37 y.o. female.  HPI: she has a history of alcohol use in the past denies any alcohol for the past 10 years. She has had persistent abdominal pain for some time. The time frame in this is somewhat vague but it appears to have been for several months. A CT scan was obtained recently which showed diffuse thickening of the liver parenchyma and edema with gallbladder wall thickening and some ascites. There were no dilated ducts seen. The patient has both admitted to and denied taking Tylenol as well as narcotics IV. She told me she'd never used IV drugs until the past month but apparently has told others it is been going on for 8 months. Her drug screen was positive. She told me that she had never been icteric until the past week or so. She denies any known prior history of hepatitis. Her main complaint is abdominal pain. Current labs reveal bilirubin 25.1 down slightly from 32 several days ago transaminases remain in the thousands alkaline phosphatase up slightly. Her studies reveal normal iron panel, platelet count, acetaminophen level. Proton slightly elevated in 18. Hepatitis ANB serology negative Hepatitis C antibody reacted. Test for HIV negative. ANA, smooth muscle in the body, AMA, hepatitis C quantitative RNA all are pending  Past Medical History  Diagnosis Date  . Liver disease     Diagnosed in 2014, no work-up to date  . Scoliosis     Per patient report, with chronic back pain, on chronic opioids  . Tobacco abuse   . IV drug abuse     Past Surgical History  Procedure Laterality Date  . Cesarean section      History reviewed. No pertinent family history.  Social History:  reports that she has been smoking Cigarettes.  She has a 25 pack-year smoking history. She has never used smokeless tobacco. She reports that she uses illicit drugs (Cocaine and  Marijuana). She reports that she does not drink alcohol.  Allergies: No Known Allergies  Medications; . docusate sodium  100 mg Oral BID  . heparin  5,000 Units Subcutaneous Q8H  .  morphine injection  4 mg Intravenous Once   PRN Meds ALPRAZolam, ondansetron, oxyCODONE Results for orders placed during the hospital encounter of 05/27/13 (from the past 48 hour(s))  CBC WITH DIFFERENTIAL     Status: Abnormal   Collection Time    05/27/13  9:24 AM      Result Value Range   WBC 6.5  4.0 - 10.5 K/uL   RBC 4.91  3.87 - 5.11 MIL/uL   Hemoglobin 14.3  12.0 - 15.0 g/dL   HCT 95.6  21.3 - 08.6 %   MCV 80.9  78.0 - 100.0 fL   MCH 29.1  26.0 - 34.0 pg   MCHC 36.0  30.0 - 36.0 g/dL   RDW 57.8 (*) 46.9 - 62.9 %   Platelets 288  150 - 400 K/uL   Neutrophils Relative % 57  43 - 77 %   Neutro Abs 3.7  1.7 - 7.7 K/uL   Lymphocytes Relative 28  12 - 46 %   Lymphs Abs 1.8  0.7 - 4.0 K/uL   Monocytes Relative 9  3 - 12 %   Monocytes Absolute 0.6  0.1 - 1.0 K/uL   Eosinophils Relative 5  0 - 5 %   Eosinophils Absolute 0.3  0.0 - 0.7 K/uL  Basophils Relative 1  0 - 1 %   Basophils Absolute 0.1  0.0 - 0.1 K/uL  COMPREHENSIVE METABOLIC PANEL     Status: Abnormal   Collection Time    05/27/13  9:24 AM      Result Value Range   Sodium 137  135 - 145 mEq/L   Potassium 3.2 (*) 3.5 - 5.1 mEq/L   Chloride 99  96 - 112 mEq/L   CO2 27  19 - 32 mEq/L   Glucose, Bld 86  70 - 99 mg/dL   BUN 7  6 - 23 mg/dL   Creatinine, Ser 1.61  0.50 - 1.10 mg/dL   Comment: ICTERUS AT THIS LEVEL MAY AFFECT RESULT   Calcium 9.3  8.4 - 10.5 mg/dL   Total Protein 5.8 (*) 6.0 - 8.3 g/dL   Comment: ICTERUS AT THIS LEVEL MAY AFFECT RESULT   Albumin 3.0 (*) 3.5 - 5.2 g/dL   AST 0960 (*) 0 - 37 U/L   ALT 1471 (*) 0 - 35 U/L   Alkaline Phosphatase 254 (*) 39 - 117 U/L   Total Bilirubin 32.0 (*) 0.3 - 1.2 mg/dL   Comment: REPEATED TO VERIFY     CRITICAL RESULT CALLED TO, READ BACK BY AND VERIFIED WITH:     K.COBB,RN  05/27/13 1037 CLARK,S   GFR calc non Af Amer >90  >90 mL/min   GFR calc Af Amer >90  >90 mL/min   Comment: (NOTE)     The eGFR has been calculated using the CKD EPI equation.     This calculation has not been validated in all clinical situations.     eGFR's persistently <90 mL/min signify possible Chronic Kidney     Disease.  LIPASE, BLOOD     Status: None   Collection Time    05/27/13  9:24 AM      Result Value Range   Lipase 26  11 - 59 U/L  APTT     Status: Abnormal   Collection Time    05/27/13  9:24 AM      Result Value Range   aPTT 44 (*) 24 - 37 seconds   Comment:            IF BASELINE aPTT IS ELEVATED,     SUGGEST PATIENT RISK ASSESSMENT     BE USED TO DETERMINE APPROPRIATE     ANTICOAGULANT THERAPY.  AMMONIA     Status: None   Collection Time    05/27/13  9:24 AM      Result Value Range   Ammonia UNABLE TO DETERMINE DUE TO ICTERUS  11 - 60 umol/L   Comment: NOTIFIED K.COBB,RN 1037 05/27/13 CLARK,S  ETHANOL     Status: None   Collection Time    05/27/13  9:24 AM      Result Value Range   Alcohol, Ethyl (B) <11  0 - 11 mg/dL   Comment: ICTERUS AT THIS LEVEL MAY AFFECT RESULT                LOWEST DETECTABLE LIMIT FOR     SERUM ALCOHOL IS 11 mg/dL     FOR MEDICAL PURPOSES ONLY  RAPID HIV SCREEN ([facility])     Status: None   Collection Time    05/27/13  9:24 AM      Result Value Range   SUDS Rapid HIV Screen NON REACTIVE  NON REACTIVE  HEPATITIS PANEL, ACUTE     Status: Abnormal   Collection Time  05/27/13  9:24 AM      Result Value Range   Hepatitis B Surface Ag NEGATIVE  NEGATIVE   HCV Ab Reactive (*) NEGATIVE   Comment: (NOTE)                                                                               This test is for screening purposes only.  Reactive results should be     confirmed by an alternative method.  Suggest HCV Qualitative, PCR,     test code 16109.  Specimens will be stable for reflex testing up to 3     days after collection.   Hep A IgM  NEGATIVE  NEGATIVE   Hep B C IgM NEGATIVE  NEGATIVE   Comment: (NOTE)     High levels of Hepatitis B Core IgM antibody are detectable     during the acute stage of Hepatitis B. This antibody is used     to differentiate current from past HBV infection.     Performed at [facility]  PROTIME-INR     Status: Abnormal   Collection Time    05/27/13  9:24 AM      Result Value Range   Prothrombin Time 18.0 (*) 11.6 - 15.2 seconds   INR 1.53 (*) 0.00 - 1.49  URINALYSIS, ROUTINE W REFLEX MICROSCOPIC     Status: Abnormal   Collection Time    05/27/13 11:00 AM      Result Value Range   Color, Urine AMBER (*) YELLOW   Comment: BIOCHEMICALS MAY BE AFFECTED BY COLOR   APPearance CLOUDY (*) CLEAR   Specific Gravity, Urine 1.019  1.005 - 1.030   pH 5.5  5.0 - 8.0   Glucose, UA NEGATIVE  NEGATIVE mg/dL   Hgb urine dipstick NEGATIVE  NEGATIVE   Bilirubin Urine LARGE (*) NEGATIVE   Ketones, ur 15 (*) NEGATIVE mg/dL   Protein, ur NEGATIVE  NEGATIVE mg/dL   Urobilinogen, UA 1.0  0.0 - 1.0 mg/dL   Nitrite POSITIVE (*) NEGATIVE   Leukocytes, UA SMALL (*) NEGATIVE  URINE RAPID DRUG SCREEN (HOSP PERFORMED)     Status: Abnormal   Collection Time    05/27/13 11:00 AM      Result Value Range   Opiates POSITIVE (*) NONE DETECTED   Cocaine POSITIVE (*) NONE DETECTED   Benzodiazepines POSITIVE (*) NONE DETECTED   Amphetamines NONE DETECTED  NONE DETECTED   Tetrahydrocannabinol POSITIVE (*) NONE DETECTED   Barbiturates NONE DETECTED  NONE DETECTED   Comment:            DRUG SCREEN FOR MEDICAL PURPOSES     ONLY.  IF CONFIRMATION IS NEEDED     FOR ANY PURPOSE, NOTIFY LAB     WITHIN 5 DAYS.                LOWEST DETECTABLE LIMITS     FOR URINE DRUG SCREEN     Drug Class       Cutoff (ng/mL)     Amphetamine      1000     Barbiturate      200     Benzodiazepine   200  Tricyclics       300     Opiates          300     Cocaine          300     THC              50  URINE MICROSCOPIC-ADD  ON     Status: Abnormal   Collection Time    05/27/13 11:00 AM      Result Value Range   Squamous Epithelial / LPF FEW (*) RARE   WBC, UA 3-6  <3 WBC/hpf   RBC / HPF 0-2  <3 RBC/hpf   Bacteria, UA MANY (*) RARE   Casts GRANULAR CAST (*) NEGATIVE  URINE CULTURE     Status: None   Collection Time    05/27/13 11:00 AM      Result Value Range   Specimen Description URINE, CLEAN CATCH     Special Requests NONE     Culture  Setup Time       Value: 05/27/2013 12:12     Performed at [facility] Count       Value: >=100,000 COLONIES/ML     Performed at [facility]   Culture       Value: ESCHERICHIA COLI     Performed at [facility]   Report Status PENDING    POCT PREGNANCY, URINE     Status: None   Collection Time    05/27/13 11:49 AM      Result Value Range   Preg Test, Ur NEGATIVE  NEGATIVE   Comment:            THE SENSITIVITY OF THIS     METHODOLOGY IS >24 mIU/mL  HEPATIC FUNCTION PANEL     Status: Abnormal   Collection Time    05/27/13  1:16 PM      Result Value Range   Total Protein 5.2 (*) 6.0 - 8.3 g/dL   Comment: ICTERUS AT THIS LEVEL MAY AFFECT RESULT   Albumin 2.6 (*) 3.5 - 5.2 g/dL   AST 4098 (*) 0 - 37 U/L   ALT 1286 (*) 0 - 35 U/L   Alkaline Phosphatase 227 (*) 39 - 117 U/L   Total Bilirubin 27.8 (*) 0.3 - 1.2 mg/dL   Comment: CRITICAL RESULT CALLED TO, READ BACK BY AND VERIFIED WITH:     KOREDE,E RN @ 1425 ON 05/27/13 BY LEONARD,A   Bilirubin, Direct 21.9 (*) 0.0 - 0.3 mg/dL   Comment: CORRECTED RESULTS CALLED TO:     CALWITAN,Z RN @ 0825 ON 05/28/13 BY LEONARD,A     RESULTS CONFIRMED BY MANUAL DILUTION     CORRECTED ON 08/20 AT 0830: PREVIOUSLY REPORTED AS 18.5 RESULTS CONFIRMED BY MANUAL DILUTION   Indirect Bilirubin 9.3 (*) 0.3 - 0.9 mg/dL  MAGNESIUM     Status: None   Collection Time    05/27/13  1:16 PM      Result Value Range   Magnesium 1.8  1.5 - 2.5 mg/dL  PHOSPHORUS     Status: None   Collection Time     05/27/13  1:16 PM      Result Value Range   Phosphorus 3.2  2.3 - 4.6 mg/dL  ACETAMINOPHEN LEVEL     Status: Abnormal   Collection Time    05/27/13  1:16 PM      Result Value Range   Acetaminophen (Tylenol), Serum <10.0 (*)  10 - 30 ug/mL   Comment: Performed at [facility]  BASIC METABOLIC PANEL     Status: Abnormal   Collection Time    05/28/13  5:00 AM      Result Value Range   Sodium 139  135 - 145 mEq/L   Potassium 3.9  3.5 - 5.1 mEq/L   Comment: DELTA CHECK NOTED   Chloride 105  96 - 112 mEq/L   CO2 24  19 - 32 mEq/L   Glucose, Bld 85  70 - 99 mg/dL   BUN 5 (*) 6 - 23 mg/dL   Creatinine, Ser 3.08  0.50 - 1.10 mg/dL   Comment: ICTERUS AT THIS LEVEL MAY AFFECT RESULT   Calcium 8.6  8.4 - 10.5 mg/dL   GFR calc non Af Amer >90  >90 mL/min   GFR calc Af Amer >90  >90 mL/min   Comment: (NOTE)     The eGFR has been calculated using the CKD EPI equation.     This calculation has not been validated in all clinical situations.     eGFR's persistently <90 mL/min signify possible Chronic Kidney     Disease.  CBC     Status: Abnormal   Collection Time    05/28/13  5:00 AM      Result Value Range   WBC 8.9  4.0 - 10.5 K/uL   RBC 5.20 (*) 3.87 - 5.11 MIL/uL   Hemoglobin 15.2 (*) 12.0 - 15.0 g/dL   HCT 65.7  84.6 - 96.2 %   MCV 79.0  78.0 - 100.0 fL   MCH 29.2  26.0 - 34.0 pg   MCHC 37.0 (*) 30.0 - 36.0 g/dL   RDW 95.2 (*) 84.1 - 32.4 %   Platelets 230  150 - 400 K/uL  HEPATIC FUNCTION PANEL     Status: Abnormal   Collection Time    05/28/13  5:00 AM      Result Value Range   Total Protein 4.7 (*) 6.0 - 8.3 g/dL   Comment: ICTERUS AT THIS LEVEL MAY AFFECT RESULT   Albumin 2.3 (*) 3.5 - 5.2 g/dL   AST 4010 (*) 0 - 37 U/L   ALT 1075 (*) 0 - 35 U/L   Alkaline Phosphatase 214 (*) 39 - 117 U/L   Total Bilirubin 25.2 (*) 0.3 - 1.2 mg/dL   Comment: CRITICAL VALUE NOTED.  VALUE IS CONSISTENT WITH PREVIOUSLY REPORTED AND CALLED VALUE.   Bilirubin, Direct  19.5 (*) 0.0 - 0.3 mg/dL   Comment: RESULTS CONFIRMED BY MANUAL DILUTION     ICTERIC SPECIMEN   Indirect Bilirubin 5.7 (*) 0.3 - 0.9 mg/dL  IRON AND TIBC     Status: None   Collection Time    05/28/13  4:42 PM      Result Value Range   Iron 95  42 - 135 ug/dL   TIBC 272  536 - 644 ug/dL   Saturation Ratios 35  20 - 55 %   UIBC 178  125 - 400 ug/dL   Comment: Performed at [facility]  FERRITIN     Status: None   Collection Time    05/28/13  4:42 PM      Result Value Range   Ferritin 170  10 - 291 ng/mL   Comment: Performed at [facility]  CBC     Status: Abnormal   Collection Time    05/29/13  5:00 AM  Result Value Range   WBC 7.4  4.0 - 10.5 K/uL   RBC 4.99  3.87 - 5.11 MIL/uL   Hemoglobin 13.9  12.0 - 15.0 g/dL   HCT 16.1  09.6 - 04.5 %   MCV 79.6  78.0 - 100.0 fL   MCH 27.9  26.0 - 34.0 pg   MCHC 35.0  30.0 - 36.0 g/dL   RDW 40.9 (*) 81.1 - 91.4 %   Platelets 240  150 - 400 K/uL  COMPREHENSIVE METABOLIC PANEL     Status: Abnormal   Collection Time    05/29/13  5:00 AM      Result Value Range   Sodium 140  135 - 145 mEq/L   Potassium 3.4 (*) 3.5 - 5.1 mEq/L   Chloride 105  96 - 112 mEq/L   CO2 27  19 - 32 mEq/L   Glucose, Bld 87  70 - 99 mg/dL   BUN 6  6 - 23 mg/dL   Creatinine, Ser 7.82  0.50 - 1.10 mg/dL   Comment: ICTERUS AT THIS LEVEL MAY AFFECT RESULT   Calcium 8.4  8.4 - 10.5 mg/dL   Total Protein 4.9 (*) 6.0 - 8.3 g/dL   Comment: ICTERUS AT THIS LEVEL MAY AFFECT RESULT   Albumin 2.3 (*) 3.5 - 5.2 g/dL   AST 9562 (*) 0 - 37 U/L   ALT 879 (*) 0 - 35 U/L   Alkaline Phosphatase 197 (*) 39 - 117 U/L   Total Bilirubin 25.1 (*) 0.3 - 1.2 mg/dL   Comment: CRITICAL VALUE NOTED.  VALUE IS CONSISTENT WITH PREVIOUSLY REPORTED AND CALLED VALUE.   GFR calc non Af Amer >90  >90 mL/min   GFR calc Af Amer >90  >90 mL/min   Comment: (NOTE)     The eGFR has been calculated using the CKD EPI equation.     This calculation has not been validated in  all clinical situations.     eGFR's persistently <90 mL/min signify possible Chronic Kidney     Disease.    Ct Abdomen Pelvis W Contrast  05/27/2013   *RADIOLOGY REPORT*  Clinical Data: Jaundiced.  Diffuse abdominal pain with vomiting.  CT ABDOMEN AND PELVIS WITH CONTRAST  Technique:  Multidetector CT imaging of the abdomen and pelvis was performed following the standard protocol during bolus administration of intravenous contrast.  Contrast: 80mL OMNIPAQUE IOHEXOL 300 MG/ML  SOLN  Comparison: CT and ultrasound 01/22/2013.  Findings: Lung bases show dependent atelectasis bilaterally.  Heart size normal.  No pericardial or pleural fluid.  Liver is heterogeneous, as before.  Periportal edema.  Gallbladder wall is thickened with prominent vessels surrounding it, similar to the prior exam.  Extrahepatic bile duct is not dilated, measuring approximately 5 mm.  Adrenal glands, kidneys, spleen, pancreas, stomach and bowel are unremarkable.  Ascites.  No free air.  There may be a borderline enlarged 10 mm gastrohepatic ligament lymph node.  Porta hepatis lymph node also appears prominent, measuring 1.3 cm.  No worrisome lytic or sclerotic lesions. 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  IMPRESSION:  1.  Diffuse heterogeneity of the liver parenchyma and periportal edema, similar to 01/22/2013. 2.  Marked gallbladder wall thickening, as on the prior exam. 3.  New ascites.   Original Report Authenticated By: Leanna Battles, M.D.   Dg Abd Acute W/chest  05/27/2013   *RADIOLOGY REPORT*  Clinical Data: Liver disease, abdominal pain.  ACUTE ABDOMEN SERIES (  ABDOMEN 2 VIEW & CHEST 1 VIEW)  Comparison: 12/15/2012.  Findings: Trachea is midline.  Heart size normal.  Nipple shadows project over the lower hemithoraces.  Lungs are clear.  Question trace left pleural effusion.  Two views of the abdomen show gas and stool in the colon.  No small bowel dilatation.  There may be a  few scattered air fluid levels.  IMPRESSION:  1.  Bowel gas pattern is somewhat nonspecific.  Constipation is queried. 2.  Suspect tiny left pleural effusion.   Original Report Authenticated By: Leanna Battles, M.D.   ROS: as above            Blood pressure 107/61, pulse 80, temperature 98.4 F (36.9 C), temperature source Oral, resp. rate 18, height 5\' 2"  (1.575 m), weight 54.568 kg (120 lb 4.8 oz), last menstrual period 05/20/2013, SpO2 95.00%.  Physical exam:   General-- tearful icteric white female Heart-- regular rate and rhythm without murmurs are gallops Lungs--clear Abdomen-- complaining of pain anywhere touched in her abdomen.   Assessment: 1. Acute Hepatitis. With history about the drug use is very possible this could be hepatitis C. Her liver abnormalities appeared to be stabilized.  2 Abdominal Pain. Maybe all from swollen liver. No other obvious cause on CT scan..  Plan: 1. We'll wait for the results of her other studies specifically smooth muscle antibody and ANA for possible autoimmune hepatitis. 2. Continue supportive therapy 3. Continue to monitor liver tests. Would suggest daily PT. This is one of the more sensitive markers for impending hepatic failure.   Sheela Mcculley JR,Amarachukwu Lakatos L 05/29/2013, 7:15 AM

## 2013-05-30 LAB — COMPREHENSIVE METABOLIC PANEL
AST: 1168 U/L — ABNORMAL HIGH (ref 0–37)
Albumin: 2.3 g/dL — ABNORMAL LOW (ref 3.5–5.2)
CO2: 23 mEq/L (ref 19–32)
Calcium: 8.5 mg/dL (ref 8.4–10.5)
Creatinine, Ser: 0.78 mg/dL (ref 0.50–1.10)
GFR calc non Af Amer: >90 mL/min (ref >90)

## 2013-05-30 LAB — CBC
MCH: 28.3 pg (ref 26.0–34.0)
MCHC: 35 g/dL (ref 30.0–36.0)
MCV: 81 fL (ref 78.0–100.0)
Platelets: 260 10*3/uL (ref 150–400)
RDW: 19 % — ABNORMAL HIGH (ref 11.5–15.5)

## 2013-05-30 LAB — PROTIME-INR: INR: 1.38 (ref 0.00–1.49)

## 2013-05-30 LAB — CERULOPLASMIN: Ceruloplasmin: 31 mg/dL (ref 20–60)

## 2013-05-30 LAB — ANTI-SMOOTH MUSCLE ANTIBODY, IGG: F-Actin IgG: 7 U (ref <20)

## 2013-05-30 LAB — MITOCHONDRIAL ANTIBODIES: Mitochondrial M2 Ab, IgG: 0.46 (ref <0.91)

## 2013-05-30 MED ORDER — LORAZEPAM 0.5 MG PO TABS
0.5000 mg | ORAL_TABLET | Freq: Two times a day (BID) | ORAL | Status: DC
  Administered 2013-05-30 – 2013-05-31 (×2): 0.5 mg via ORAL
  Filled 2013-05-30 (×3): qty 1

## 2013-05-30 NOTE — Progress Notes (Signed)
Internal Medicine Attending  Date: 05/30/2013  Patient name: Vicki Conrad Medical record number: [id number] Date of birth: September 04, 1976 Age: 37 y.o. Gender: female  I saw and evaluated the patient. I reviewed the resident's note by Dr. Yetta Barre and I agree with the resident's findings and plans as documented in his note.

## 2013-05-30 NOTE — Progress Notes (Signed)
Subjective: Vicki Conrad is a 37 y.o. female w/ PMHx of of IV drug use, presenting with jaundice and abdominal pain.   Seen this AM at bedside. Says she is feeling much better today. Her abdominal pain is much less this AM, however still with mild guarding on exam. Had a BM this AM, described it as very pale in color, much different than her normal BM's. Denies any nausea, vomiting, fever or chills. Says her pain is well controlled on the pain medications being prescribed.  Hepatitis panel yesterday showed HCV Ab +ve. Hep A and B negative. HCV quant came back at 8485. Working up further as her LFT's are much higher than suspected in pure HCV infection. ANA -ve.  Objective: Vital signs in last 24 hours: Filed Vitals:   05/29/13 0542 05/29/13 1427 05/29/13 2112 05/30/13 0539  BP: 107/61 106/72 107/66 101/71  Pulse: 80 68 73 69  Temp: 98.4 F (36.9 C) 98.4 F (36.9 C) 98.4 F (36.9 C) 97.9 F (36.6 C)  TempSrc: Oral  Oral Oral  Resp: 18 16 17 16   Height:      Weight:      SpO2: 95% 95% 96% 98%   Weight change:   Intake/Output Summary (Last 24 hours) at 05/30/13 4098 Last data filed at 05/29/13 2200  Gross per 24 hour  Intake    720 ml  Output    200 ml  Net    520 ml   Physical Exam: General: Alert, cooperative, and in mild distress d/t pain. Tearful during exam. HEENT: Vision grossly intact, oropharynx clear and non-erythematous. Significant scleral icterus. Neck: Full range of motion without pain, supple, no lymphadenopathy or carotid bruits Lungs: Clear to ascultation bilaterally, normal work of respiration, no wheezes, rales, ronchi Heart: Regular rate and rhythm, no murmurs, gallops, or rubs Abdomen: Soft, tender, non-distended. Bowel sounds present. Guarding on palpation w/ decreased tenderness diffusely to light touch. Not able to palpate liver, d/t guarding. Much less than yesterday. Extremities: No cyanosis, clubbing, or edema Neurologic: Alert & oriented X3,  cranial nerves II-XII intact, strength grossly intact, sensation intact to light touch  Lab Results: Basic Metabolic Panel:  Recent Labs Lab 05/27/13 0924 05/27/13 1316 05/28/13 0500 05/29/13 0500  NA 137  --  139 140  K 3.2*  --  3.9 3.4*  CL 99  --  105 105  CO2 27  --  24 27  GLUCOSE 86  --  85 87  BUN 7  --  5* 6  CREATININE 0.81  --  0.79 0.80  CALCIUM 9.3  --  8.6 8.4  MG  --  1.8  --   --   PHOS  --  3.2  --   --    Liver Function Tests:  Recent Labs Lab 05/28/13 0500 05/29/13 0500  AST 1716* 1293*  ALT 1075* 879*  ALKPHOS 214* 197*  BILITOT 25.2* 25.1*  PROT 4.7* 4.9*  ALBUMIN 2.3* 2.3*    Recent Labs Lab 05/27/13 0924  LIPASE 26    Recent Labs Lab 05/27/13 0924  AMMONIA UNABLE TO DETERMINE DUE TO ICTERUS   CBC:  Recent Labs Lab 05/27/13 0924 05/28/13 0500 05/29/13 0500  WBC 6.5 8.9 7.4  NEUTROABS 3.7  --   --   HGB 14.3 15.2* 13.9  HCT 39.7 41.1 39.7  MCV 80.9 79.0 79.6  PLT 288 230 240   Coagulation:  Recent Labs Lab 05/27/13 0924 05/29/13 0855  LABPROT 18.0* 17.4*  INR  1.53* 1.47   Anemia Panel:  Recent Labs Lab 05/28/13 1642  FERRITIN 170  TIBC 273  IRON 95   Urine Drug Screen: Drugs of Abuse     Component Value Date/Time   LABOPIA POSITIVE* 05/27/2013 1100   COCAINSCRNUR POSITIVE* 05/27/2013 1100   LABBENZ POSITIVE* 05/27/2013 1100   AMPHETMU NONE DETECTED 05/27/2013 1100   THCU POSITIVE* 05/27/2013 1100   LABBARB NONE DETECTED 05/27/2013 1100    Alcohol Level:  Recent Labs Lab 05/27/13 0924  ETH <11   Urinalysis:  Recent Labs Lab 05/27/13 1100  COLORURINE AMBER*  LABSPEC 1.019  PHURINE 5.5  GLUCOSEU NEGATIVE  HGBUR NEGATIVE  BILIRUBINUR LARGE*  KETONESUR 15*  PROTEINUR NEGATIVE  UROBILINOGEN 1.0  NITRITE POSITIVE*  LEUKOCYTESUR SMALL*   Micro Results: Recent Results (from the past 240 hour(s))  URINE CULTURE     Status: None   Collection Time    05/27/13 11:00 AM      Result Value Range  Status   Specimen Description URINE, CLEAN CATCH   Final   Special Requests NONE   Final   Culture  Setup Time     Final   Value: 05/27/2013 12:12     Performed at [facility] Count     Final   Value: >=100,000 COLONIES/ML     Performed at [facility]   Culture     Final   Value: ESCHERICHIA COLI     Performed at [facility]   Report Status 05/29/2013 FINAL   Final   Organism ID, Bacteria ESCHERICHIA COLI   Final   Studies/Results: No results found. Medications: I have reviewed the patient's current medications. Scheduled Meds: . docusate sodium  100 mg Oral BID  . heparin  5,000 Units Subcutaneous Q8H  .  morphine injection  4 mg Intravenous Once   Continuous Infusions:  PRN Meds:.ALPRAZolam, ondansetron, oxyCODONE  Assessment/Plan:  #Acute Hepatitis-  37 y.o. female w/ PMHx of of IV drug use, presenting with jaundice and abdominal pain.  On admission, AST 2148, ALT 1471, Alk Phos 254, total bili 32. This AM, LFT's were AST 1168, ALT 856, Alk Phos 199, total bili 24.8. Viral Hepatitis panel +ve for HCV AB. HCV quant at 8485.  -GI saw patient yesterday. Appreciate consult. Sent for labs to rule out other concomitant liver pathology; ANA (negative), anti-smooth muscle AB, anti-mitochondrial AB (negative), ceruloplasmin, and Iron panel (wnl). Others still pending. -Sent for health records for other institutions to determine previous health issues. [facility] have been obtained from [facility] and [facility]. She had elevated AST 133 and ALT 95 and UDS positive for cocaine, benzodiazepines, opiates, oxycodone, THC, and tricyclics at [facility] on 01/21/13. It appears that she left AMA before workup was complete. -Still trending LFT's. Improving. PTT and INR slightly improved today at 16.6 and 1.38, respectively. -Continue oxycodone prn for pain.  #Anxiety- Switched from Xanax 0.25 mg prn tid to Ativan 0.5 mg bid as this  medication is not metabolized in the liver.  #DVT/PE PPx- heparin  Dispo: Disposition is deferred at this time, awaiting improvement of current medical problems.  Anticipated discharge in approximately 1-2 day(s).   The patient does not have a current PCP (No Pcp Per Patient) and does need an [facility] hospital follow-up appointment after discharge.  The patient does not have transportation limitations that hinder transportation to clinic appointments.  .Services Needed at time of discharge: Y = Yes, Blank = No  PT: N  OT: N  RN: N  Equipment: N  Other: N    LOS: 3 days   Lars Masson, MD 05/30/2013, 6:49 AM Pager: [phone]

## 2013-05-30 NOTE — Progress Notes (Signed)
  I have seen and examined the patient myself, and I have reviewed the note by Jerolyn Shin, MS 3 and was present during the interview and physical exam.  Please see my separate H&P for additional findings, assessment, and plan.   Signed: Lars Masson, MD 05/30/2013, 3:48 PM

## 2013-05-30 NOTE — Progress Notes (Signed)
Patient ID: Vicki Conrad, female   DOB: March 13, 1976, 37 y.o.   MRN: [id number]   Subjective: [name] is a 37 yo woman with a history of anxiety and IV drug use who presents with acute hepatitis.  No acute events overnight. This morning she feels better and asks if she can go home today or walk outside. She had a bowel movement earlier in the day. She denies nausea, vomiting, diarrhea, or chest pain.  Objective: Vital signs in last 24 hours: Filed Vitals:   05/29/13 0542 05/29/13 1427 05/29/13 2112 05/30/13 0539  BP: 107/61 106/72 107/66 101/71  Pulse: 80 68 73 69  Temp: 98.4 F (36.9 C) 98.4 F (36.9 C) 98.4 F (36.9 C) 97.9 F (36.6 C)  TempSrc: Oral  Oral Oral  Resp: 18 16 17 16   Height:      Weight:      SpO2: 95% 95% 96% 98%   Weight change:   Intake/Output Summary (Last 24 hours) at 05/30/13 1105 Last data filed at 05/29/13 2200  Gross per 24 hour  Intake    480 ml  Output    200 ml  Net    280 ml   Physical Exam  Constitutional: She appears jaundiced.  Eyes: Scleral icterus (bilaterally) is present.  Cardiovascular: Normal rate, regular rhythm and normal heart sounds.   No murmur heard. Pulmonary/Chest: Effort normal and breath sounds normal.  Abdominal: Soft. Bowel sounds are normal. There is tenderness (diffuse). There is guarding.    Lab Results: CBC    Component Value Date/Time   WBC 7.4 05/30/2013 0540   RBC 5.15* 05/30/2013 0540   HGB 14.6 05/30/2013 0540   HCT 41.7 05/30/2013 0540   PLT 260 05/30/2013 0540   MCV 81.0 05/30/2013 0540   MCH 28.3 05/30/2013 0540   MCHC 35.0 05/30/2013 0540   RDW 19.0* 05/30/2013 0540   LYMPHSABS 1.8 05/27/2013 0924   MONOABS 0.6 05/27/2013 0924   EOSABS 0.3 05/27/2013 0924   BASOSABS 0.1 05/27/2013 0924    CMP     Component Value Date/Time   NA 139 05/30/2013 0540   K 4.3 05/30/2013 0540   CL 105 05/30/2013 0540   CO2 23 05/30/2013 0540   GLUCOSE 95 05/30/2013 0540   BUN 6 05/30/2013 0540   CREATININE 0.78 05/30/2013  0540   CALCIUM 8.5 05/30/2013 0540   PROT 5.3* 05/30/2013 0540   ALBUMIN 2.3* 05/30/2013 0540   AST 1168* 05/30/2013 0540   ALT 856* 05/30/2013 0540   ALKPHOS 199* 05/30/2013 0540   BILITOT 24.8* 05/30/2013 0540   GFRNONAA >90 05/30/2013 0540   GFRAA >90 05/30/2013 0540    Hepatic Function Panel     Component Value Date/Time   PROT 5.3* 05/30/2013 0540   ALBUMIN 2.3* 05/30/2013 0540   AST 1168* 05/30/2013 0540   ALT 856* 05/30/2013 0540   ALKPHOS 199* 05/30/2013 0540   BILITOT 24.8* 05/30/2013 0540   BILIDIR 19.5* 05/28/2013 0500   IBILI 5.7* 05/28/2013 0500   INR    Component Value Date/Time   INR 1.38 05/30/2013 0805   INR 1.47 05/29/2013 0855   Prothrombin Time 16.6s (05/30/13) 17.4s (05/29/13)  HCV RNA Viral Load: 8485 IU/mL ANA: negative Anti-Smooth Muscle Ab: negative  Micro Results: Recent Results (from the past 240 hour(s))  URINE CULTURE     Status: None   Collection Time    05/27/13 11:00 AM      Result Value Range Status   Specimen Description URINE,  CLEAN CATCH   Final   Special Requests NONE   Final   Culture  Setup Time     Final   Value: 05/27/2013 12:12     Performed at [facility] Count     Final   Value: >=100,000 COLONIES/ML     Performed at [facility]   Culture     Final   Value: ESCHERICHIA COLI     Performed at [facility]   Report Status 05/29/2013 FINAL   Final   Organism ID, Bacteria ESCHERICHIA COLI   Final   Studies/Results: No results found. Medications: I have reviewed the patient's current medications. Scheduled Meds:  docusate sodium  100 mg Oral BID   heparin  5,000 Units Subcutaneous Q8H    morphine injection  4 mg Intravenous Once   Continuous Infusions:  PRN Meds:.ALPRAZolam, ondansetron, oxyCODONE Assessment/Plan: Principal Problem:   Acute hepatitis Active Problems:   Polysubstance abuse  [name] is a 37 yo woman with a history of anxiety and IV drug use who presents with acute  hepatitis.  **Acute hepatitis  HCV RNA PCR confirms that the patient is positive for Hepatitis C. Negative Hepatitis A and B antibodies. LFTs continue to trend down since admission. Medical records have been obtained from [facility] and [facility] She had elevated AST 133 and ALT 95 and UDS positive for cocaine, benzodiazepines, opiates, oxycodone, THC, and tricyclics at [facility] on 01/21/13. It appears that she left AMA before workup was complete. No transaminitis in March of this year at [facility] where she was seen for gastroenteritis. At this time it remains unclear whether the patient is presenting with acute or chronic hepatitis C and if there are additional liver insults from other sources. Her transaminases are more elevated than expected for hepatitis C infection alone. Will work up for additional etiologies. GI consulted and feel the presentation could be explained by Hepatitis C infection and the abdominal pain caused by liver swelling. Iron studies are normal so hemochromatosis is unlikely. ANA negative. Anti-Smooth Muscle Ab negative. -Appreciate GI recommendations: follow daily PT as it is a sensitive marker of impending liver failure.  -Serum ceruloplasmin pending  -Trend LFTs  -Supportive care  -Oxycodone 5 mg q 4 hours prn (must be cautious due to liver injury)  **Anxiety  -Switch Xanax 0.25 mg TID prn to lorazepam (Ativan) 0.5 mg BID prn to reduce the requirement for liver metabolism  -Offer outpatient counseling as a resource  **Constipation  -docusate sodium 100 mg BID  **UTI  She is asymptomatic and not pregnant. Patient specifically denies dysuria.  **DVT PPx  -Heparin **Dispo -Anticipate discharge tomorrow with outpatient follow up   This is a Psychologist, occupational Note.  The care of the patient was discussed with Dr. Lauris Chroman and the assessment and plan formulated with their assistance.  Please see their attached note for official  documentation of the daily encounter.   LOS: 3 days   Merrie Roof, MS3 05/30/2013, 2:40 PM

## 2013-05-30 NOTE — Progress Notes (Signed)
[facility] PROGRESS NOTE Subjective Pt c/o abd pain States she is not receiving enough pain meds. Is having BMs  Objective: Vital signs in last 24 hours: Temp:  [97.9 F (36.6 C)-98.4 F (36.9 C)] 97.9 F (36.6 C) (08/22 0539) Pulse Rate:  [68-73] 69 (08/22 0539) Resp:  [16-17] 16 (08/22 0539) BP: (101-107)/(66-72) 101/71 mmHg (08/22 0539) SpO2:  [95 %-98 %] 98 % (08/22 0539) Last BM Date: 05/28/13  Intake/Output from previous day: 08/21 0701 - 08/22 0700 In: 720 [P.O.:720] Out: 200 [Urine:200] Intake/Output this shift:    PE: General--icteric F NAD Heart-- Lungs-- Abdomen--tightened abd, c/o pain everywhere, good BSs  Lab Results:  Recent Labs  05/28/13 0500 05/29/13 0500 05/30/13 0540  WBC 8.9 7.4 7.4  HGB 15.2* 13.9 14.6  HCT 41.1 39.7 41.7  PLT 230 240 260   BMET  Recent Labs  05/28/13 0500 05/29/13 0500 05/30/13 0540  NA 139 140 139  K 3.9 3.4* 4.3  CL 105 105 105  CO2 24 27 23   CREATININE 0.79 0.80 0.78   LFT  Recent Labs  05/27/13 1316 05/28/13 0500 05/29/13 0500 05/30/13 0540  PROT 5.2* 4.7* 4.9* 5.3*  AST 1926* 1716* 1293* 1168*  ALT 1286* 1075* 879* 856*  ALKPHOS 227* 214* 197* 199*  BILITOT 27.8* 25.2* 25.1* 24.8*  BILIDIR 21.9* 19.5*  --   --   IBILI 9.3* 5.7*  --   --    PT/INR  Recent Labs  05/29/13 0855 05/30/13 0805  LABPROT 17.4* 16.6*  INR 1.47 1.38   PANCREAS No results found for this basename: LIPASE,  in the last 72 hours       Studies/Results: No results found.  Medications: I have reviewed the patient's current medications.  Assessment/Plan: 1. Acute Hepatitis. ANA negative SM AB pending. Hep A+B negative. Pos for Hep C but unusual for this picture with acute Hep C. ? If she got acute Hep C from IV drugs and had also been abusing pain pills(with acetaminophen) In any event her liver tests appear to be stabilizing and even improving. We will follow until sure she has turned the  corner   Sargon Scouten JR,Darral Rishel L 05/30/2013, 9:48 AM

## 2013-05-31 ENCOUNTER — Other Ambulatory Visit: Payer: Self-pay | Admitting: Gastroenterology

## 2013-05-31 LAB — COMPREHENSIVE METABOLIC PANEL
ALT: 783 U/L — ABNORMAL HIGH (ref 0–35)
AST: 931 U/L — ABNORMAL HIGH (ref 0–37)
CO2: 24 mEq/L (ref 19–32)
Calcium: 8.9 mg/dL (ref 8.4–10.5)
GFR calc non Af Amer: >90 mL/min (ref >90)
Sodium: 141 mEq/L (ref 135–145)

## 2013-05-31 MED ORDER — LORAZEPAM 0.5 MG PO TABS: 0.5000 mg | ORAL_TABLET | Freq: Two times a day (BID) | ORAL | Status: DC

## 2013-05-31 MED ORDER — OXYCODONE HCL 5 MG PO TABS: 5.0000 mg | ORAL_TABLET | Freq: Four times a day (QID) | ORAL | Status: DC | PRN

## 2013-05-31 MED ORDER — OXYCODONE HCL 5 MG PO TABS
5.0000 mg | ORAL_TABLET | Freq: Four times a day (QID) | ORAL | Status: DC | PRN
  Administered 2013-05-31: 5 mg via ORAL
  Filled 2013-05-31: qty 1

## 2013-05-31 NOTE — Progress Notes (Signed)
Subjective: [name] is a 37 y.o. female w/ PMHx of of IV drug use, presenting with jaundice and abdominal pain.   Seen this AM at bedside. Says her pain is much better today. Slept well overnight. Denies any other issues.   Objective: Vital signs in last 24 hours: Filed Vitals:   05/29/13 2112 05/30/13 0539 05/30/13 2212 05/31/13 0539  BP: 107/66 101/71 91/55 96/58   Pulse: 73 69 63 64  Temp: 98.4 F (36.9 C) 97.9 F (36.6 C) 97.9 F (36.6 C) 97.6 F (36.4 C)  TempSrc: Oral Oral Oral Oral  Resp: 17 16 16 18   Height:      Weight:      SpO2: 96% 98% 98% 99%   Weight change:   Intake/Output Summary (Last 24 hours) at 05/31/13 1112 Last data filed at 05/31/13 0900  Gross per 24 hour  Intake    360 ml  Output      0 ml  Net    360 ml   Physical Exam: General: Alert, cooperative, and in mild distress d/t pain. Tearful during exam. HEENT: Vision grossly intact, oropharynx clear and non-erythematous. Significant scleral icterus. Neck: Full range of motion without pain, supple, no lymphadenopathy or carotid bruits Lungs: Clear to ascultation bilaterally, normal work of respiration, no wheezes, rales, ronchi Heart: Regular rate and rhythm, no murmurs, gallops, or rubs Abdomen: Soft, tender, non-distended. Bowel sounds present. Minimal guarding on palpation w/ decreased tenderness diffusely to light touch. Palpated liver edge. Extremities: No cyanosis, clubbing, or edema Neurologic: Alert & oriented X3, cranial nerves II-XII intact, strength grossly intact, sensation intact to light touch  Lab Results: Basic Metabolic Panel:  Recent Labs Lab 05/27/13 0924 05/27/13 1316  05/30/13 0540 05/31/13 0533  NA 137  --   < > 139 141  K 3.2*  --   < > 4.3 3.8  CL 99  --   < > 105 106  CO2 27  --   < > 23 24  GLUCOSE 86  --   < > 95 106*  BUN 7  --   < > 6 5*  CREATININE 0.81  --   < > 0.78 0.79  CALCIUM 9.3  --   < > 8.5 8.9  MG  --  1.8  --   --   --   PHOS  --  3.2   --   --   --   < > = values in this interval not displayed. Liver Function Tests:  Recent Labs Lab 05/30/13 0540 05/31/13 0533  AST 1168* 931*  ALT 856* 783*  ALKPHOS 199* 200*  BILITOT 24.8* 24.4*  PROT 5.3* 5.7*  ALBUMIN 2.3* 2.4*    Recent Labs Lab 05/27/13 0924  LIPASE 26    Recent Labs Lab 05/27/13 0924  AMMONIA UNABLE TO DETERMINE DUE TO ICTERUS   CBC:  Recent Labs Lab 05/27/13 0924  05/29/13 0500 05/30/13 0540  WBC 6.5  < > 7.4 7.4  NEUTROABS 3.7  --   --   --   HGB 14.3  < > 13.9 14.6  HCT 39.7  < > 39.7 41.7  MCV 80.9  < > 79.6 81.0  PLT 288  < > 240 260  < > = values in this interval not displayed. Coagulation:  Recent Labs Lab 05/27/13 0924 05/29/13 0855 05/30/13 0805 05/31/13 0533  LABPROT 18.0* 17.4* 16.6* 16.3*  INR 1.53* 1.47 1.38 1.34   Anemia Panel:  Recent Labs Lab 05/28/13  1642  FERRITIN 170  TIBC 273  IRON 95   Urine Drug Screen: Drugs of Abuse     Component Value Date/Time   LABOPIA POSITIVE* 05/27/2013 1100   COCAINSCRNUR POSITIVE* 05/27/2013 1100   LABBENZ POSITIVE* 05/27/2013 1100   AMPHETMU NONE DETECTED 05/27/2013 1100   THCU POSITIVE* 05/27/2013 1100   LABBARB NONE DETECTED 05/27/2013 1100    Alcohol Level:  Recent Labs Lab 05/27/13 0924  ETH <11   Urinalysis:  Recent Labs Lab 05/27/13 1100  COLORURINE AMBER*  LABSPEC 1.019  PHURINE 5.5  GLUCOSEU NEGATIVE  HGBUR NEGATIVE  BILIRUBINUR LARGE*  KETONESUR 15*  PROTEINUR NEGATIVE  UROBILINOGEN 1.0  NITRITE POSITIVE*  LEUKOCYTESUR SMALL*   Micro Results: Recent Results (from the past 240 hour(s))  URINE CULTURE     Status: None   Collection Time    05/27/13 11:00 AM      Result Value Range Status   Specimen Description URINE, CLEAN CATCH   Final   Special Requests NONE   Final   Culture  Setup Time     Final   Value: 05/27/2013 12:12     Performed at [facility] Count     Final   Value: >=100,000 COLONIES/ML     Performed at  [facility]   Culture     Final   Value: ESCHERICHIA COLI     Performed at [facility]   Report Status 05/29/2013 FINAL   Final   Organism ID, Bacteria ESCHERICHIA COLI   Final   Studies/Results: No results found. Medications: I have reviewed the patient's current medications. Scheduled Meds: . docusate sodium  100 mg Oral BID  . heparin  5,000 Units Subcutaneous Q8H  . LORazepam  0.5 mg Oral BID  .  morphine injection  4 mg Intravenous Once   Continuous Infusions:  PRN Meds:.ondansetron, oxyCODONE  Assessment/Plan:  #Acute Hepatitis-  37 y.o. female w/ PMHx of of IV drug use, presenting with jaundice and abdominal pain.  On admission, AST 2148, ALT 1471, Alk Phos 254, total bili 32. This AM, LFT's were AST 931, ALT 783, Alk Phos 200, total bili 24.4. Viral Hepatitis panel +ve for HCV AB. HCV quant at 76.  - Sent for labs to rule out other concomitant liver pathology; ANA, anti-smooth muscle AB, anti-mitochondrial AB, ceruloplasmin, and Iron panel. All negative/wnl. -Sent for health records for other institutions to determine previous health issues. Records have been obtained from [facility] and [facility]. She had elevated AST 133 and ALT 95 and UDS positive for cocaine, benzodiazepines, opiates, oxycodone, THC, and tricyclics at [facility] on 01/21/13. It appears that she left AMA before workup was complete. -Still trending LFT's. Improving. PTT and INR slightly improved today at 16.3 and 1.34, respectively. -Continue oxycodone prn for pain.  #Anxiety- Switched from Xanax 0.25 mg prn tid to Ativan 0.5 mg bid as this medication is not metabolized in the liver.  #DVT/PE PPx- heparin  Dispo:  Anticipated discharge today with follow-up next week in [facility]  The patient does not have a current PCP (No Pcp Per Patient) and does need an [facility] hospital follow-up appointment after discharge.  The patient does not have transportation  limitations that hinder transportation to clinic appointments.  .Services Needed at time of discharge: Y = Yes, Blank = No PT: N  OT: N  RN: N  Equipment: N  Other: N    LOS: 4 days   [employer]  Yetta Barre, MD 05/31/2013, 11:12 AM Pager: [phone]

## 2013-05-31 NOTE — Discharge Summary (Signed)
Name: Vicki Conrad MRN: [id number] DOB: Feb 14, 1976 37 y.o. PCP: No Pcp Per Patient  Date of Admission: 05/27/2013  8:43 AM Date of Discharge: 05/31/2013 Attending Physician: Dr. Margarito Liner Discharge Diagnosis: 1. Acute Hepatitis- Presented w/ significant jaundice, significantly elevated LFT's and severe abdominal pain for the past couple weeks. Hepatitis panel on this admission showed Hepatitis C.  2. Polysubstance abuse- On presentation to the ED, patient UDS was +ve for opiates, cocaine, benzos, and THC. She also described a history of injecting crushed up hydromorphone and other drugs. Claims she has not used alcohol since 2002.  Discharge Medications:   Medication List    STOP taking these medications       ALPRAZolam 1 MG tablet  Commonly known as:  XANAX      TAKE these medications       desogestrel-ethinyl estradiol 0.15-0.02/0.01 MG (21/5) tablet  Commonly known as:  KARIVA,AZURETTE,MIRCETTE  Take 1 tablet by mouth daily.     loratadine 10 MG tablet  Commonly known as:  CLARITIN  Take 10 mg by mouth 2 (two) times daily.     LORazepam 0.5 MG tablet  Commonly known as:  ATIVAN  Take 1 tablet (0.5 mg total) by mouth 2 (two) times daily.     oxyCODONE 5 MG immediate release tablet  Commonly known as:  Oxy IR/ROXICODONE  Take 1 tablet (5 mg total) by mouth every 6 (six) hours as needed.        Disposition and follow-up:   Vicki Conrad was discharged from [facility] in good condition.  At the hospital follow up visit please address:  1.  Presence of abdominal symptoms ie: pain, diarrhea, constipation, etc. Need to schedule GI consult for patient for long term management of Hepatitis C.   2.  Labs / imaging needed at time of follow-up: Repeat CMET  3.  Pending labs/ test needing follow-up: none  Follow-up Appointments:     Follow-up Information   Follow up with Drucie Opitz, MD.   Specialty:  Unknown Physician Specialty   Contact  information:   [address] [place] [address] [phone]       Discharge Instructions: Discharge Orders   Future Orders Complete By Expires   Call MD for:  extreme fatigue  As directed    Call MD for:  persistant dizziness or light-headedness  As directed    Call MD for:  persistant nausea and vomiting  As directed    Call MD for:  severe uncontrolled pain  As directed    Call MD for:  temperature >100.4  As directed    Diet - low sodium heart healthy  As directed    Increase activity slowly  As directed       Consultations: GI  Procedures Performed:  Ct Abdomen Pelvis W Contrast  05/27/2013   *RADIOLOGY REPORT*  Clinical Data: Jaundiced.  Diffuse abdominal pain with vomiting.  CT ABDOMEN AND PELVIS WITH CONTRAST  Technique:  Multidetector CT imaging of the abdomen and pelvis was performed following the standard protocol during bolus administration of intravenous contrast.  Contrast: 80mL OMNIPAQUE IOHEXOL 300 MG/ML  SOLN  Comparison: CT and ultrasound 01/22/2013.  Findings: Lung bases show dependent atelectasis bilaterally.  Heart size normal.  No pericardial or pleural fluid.  Liver is heterogeneous, as before.  Periportal edema.  Gallbladder wall is thickened with prominent vessels surrounding it, similar to the prior exam.  Extrahepatic bile duct is not dilated, measuring  approximately 5 mm.  Adrenal glands, kidneys, spleen, pancreas, stomach and bowel are unremarkable.  Ascites.  No free air.  There may be a borderline enlarged 10 mm gastrohepatic ligament lymph node.  Porta hepatis lymph node also appears prominent, measuring 1.3 cm.  No worrisome lytic or sclerotic lesions. 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  IMPRESSION:  1.  Diffuse heterogeneity of the liver parenchyma and periportal edema, similar to 01/22/2013. 2.  Marked gallbladder wall thickening, as on the prior exam. 3.  New ascites.   Original Report  Authenticated By: Leanna Battles, M.D.   Dg Abd Acute W/chest  05/27/2013   *RADIOLOGY REPORT*  Clinical Data: Liver disease, abdominal pain.  ACUTE ABDOMEN SERIES (ABDOMEN 2 VIEW & CHEST 1 VIEW)  Comparison: 12/15/2012.  Findings: Trachea is midline.  Heart size normal.  Nipple shadows project over the lower hemithoraces.  Lungs are clear.  Question trace left pleural effusion.  Two views of the abdomen show gas and stool in the colon.  No small bowel dilatation.  There may be a few scattered air fluid levels.  IMPRESSION:  1.  Bowel gas pattern is somewhat nonspecific.  Constipation is queried. 2.  Suspect tiny left pleural effusion.   Original Report Authenticated By: Leanna Battles, M.D.   Admission HPI:  The patient is a 37 yo woman, history of IV drug use, presenting with jaundice and abdominal pain. The patient notes an 8 month history of abdominal pain, described as a sharp, epigastric pain, which radiates to the rest of her abdomen diffusely. The pain "comes and goes", and may last for days at a time, though with some pain-free days. The pain is around its baseline. Associated symptoms include nausea for the last week, with only 1 episode of non-bloody vomiting (yesterday), and constipation. The patient also noticed yellowing of her skin for the last 1 week, and scleral icterus for the last 4 days. To manage the pain, the patient admits to taking oxycodone 30 mg TID, and injecting IV hydromorphone, as well as injecting "anything I can get my hands on", with only some relief. The patient states she has sought care for this at [facility] and [facility], and was told she had an "enlarged liver", but did not stay for admission. The patient notes no tylenol use (except as part of a friend's percocet, which she occasionally takes) since 09/2012.  Hospital Course by problem list:   1. Acute Hepatitis- Patient presented to the ED w/ significant ongoing abdominal pain that pas progressively  gotten worse over the past month. She had accompanying nausea and non-bloody, non-bilious vomiting. On exam, the patient had significant jaundice and scleral icterus, abdominal exam revealed significant guarding and tenderness to very light touch. LFT's revealed AST 2148, ALT 1471, Alk Phos 254, and total bili 32 on admission. CT abdomen on 05/27/13 showed diffuse heterogeneity of the liver parenchyma and periportal edema as well as gall bladder thickening, seen on a previous scan from 01/22/13. A new small amount of ascites was also noted. Patient has long history of polysubstance abuse including opioid medications, IVDU, and frequent use of tylenol-containing medications, however her history did not support this at times. UDS was positive for cocaine, benzos, opiates, and THC. The patient is a very poor historian. Tylenol level on admission was negative for recent use. She was admitted to the med surg floor and given very mild levels of pain control (oxycodone 5 mg  q4h) and anxiolytics (xanax 0.25 mg tid) as there was high risk for causing further liver damage.  Hepatitis panel was performed, positive for Hepatitis C. HCV RNA was 8485 and genotype 1A. After some research, it was found that Hep C infections don't frequently cause such a significant rise in LFT's, so other concomitant issues were ruled out. Sent for anti-smooth muscle AB, anti-mitochondrial AB, Iron panel, and ceruloplasmin, all discovered to be insignificant. Over the course of her admission, her pain slowly decreased, guarding became less, and icterus minimized. During her admission, we changed her Xanax to Ativan as it is not metabolized in the liver. On the day of discharge, AST 931, ALT 783, Alk phos 200, and total bili 24.4. Her symptoms significantly improved. She was sent home with 1 week supply of oxycodone 5 mg q4h, and Ativan 0.5 bid. She is supposed to follow up in our clinic this week for one hospital follow up visit and should be  referred to a GI clinic for further Hepatitis C management.  2. Polysubstance abuse- The patient presented w/ UDS +ve for cocaine, THC, benzos, and opiates. She denies alcohol use since 2002. She also says she has been managing her pain my injecting crushed up opiates including hydromorphone. She has been seen in the hospital previously and at other places such as [facility] and [place], where this same issue was documented. During her admission, her pain was maintained by oxycodone 5 mg q4h and anxiety maintained by Ativan 0.5 mg bid, both significantly lower than her home doses. She was informed many times that her drug use will significantly worsen her liver issues and she should stop immediately.   Discharge Vitals:   BP 96/58  Pulse 64  Temp(Src) 97.6 F (36.4 C) (Oral)  Resp 18  Ht 5\' 2"  (1.575 m)  Wt 120 lb 4.8 oz (54.568 kg)  BMI 22 kg/m2  SpO2 99%  LMP 05/20/2013  Discharge Labs:  Results for orders placed during the hospital encounter of 05/27/13 (from the past 24 hour(s))  COMPREHENSIVE METABOLIC PANEL     Status: Abnormal   Collection Time    05/31/13  5:33 AM      Result Value Range   Sodium 141  135 - 145 mEq/L   Potassium 3.8  3.5 - 5.1 mEq/L   Chloride 106  96 - 112 mEq/L   CO2 24  19 - 32 mEq/L   Glucose, Bld 106 (*) 70 - 99 mg/dL   BUN 5 (*) 6 - 23 mg/dL   Creatinine, Ser 1.61  0.50 - 1.10 mg/dL   Calcium 8.9  8.4 - 09.6 mg/dL   Total Protein 5.7 (*) 6.0 - 8.3 g/dL   Albumin 2.4 (*) 3.5 - 5.2 g/dL   AST 045 (*) 0 - 37 U/L   ALT 783 (*) 0 - 35 U/L   Alkaline Phosphatase 200 (*) 39 - 117 U/L   Total Bilirubin 24.4 (*) 0.3 - 1.2 mg/dL   GFR calc non Af Amer >90  >90 mL/min   GFR calc Af Amer >90  >90 mL/min  PROTIME-INR     Status: Abnormal   Collection Time    05/31/13  5:33 AM      Result Value Range   Prothrombin Time 16.3 (*) 11.6 - 15.2 seconds   INR 1.34  0.00 - 1.49    Signed: Lars Masson, MD 05/31/2013, 4:37 PM   Time Spent on Discharge: 35  minutes Services Ordered on Discharge: none Equipment  Ordered on Discharge: none

## 2013-05-31 NOTE — Progress Notes (Signed)
[facility] Progress Note  Vicki Conrad 37 y.o. 1976/02/26   Subjective: Jaundice. Denies abdominal pain. Asking what can be done about an umbilical hernia that hurts and sticks out at time. Significant other at bedside. Internal med resident in room.  Objective: Vital signs in last 24 hours: AFVSS  Physical Exam: Gen: alert, jaundice, no acute distress HEENT: +scleral icterus Abd: soft, nontender, nondistended, small reducible umbilical hernia  Lab Results:  Recent Labs  05/30/13 0540 05/31/13 0533  NA 139 141  K 4.3 3.8  CL 105 106  CO2 23 24  GLUCOSE 95 106*  BUN 6 5*  CREATININE 0.78 0.79  CALCIUM 8.5 8.9    Recent Labs  05/30/13 0540 05/31/13 0533  AST 1168* 931*  ALT 856* 783*  ALKPHOS 199* 200*  BILITOT 24.8* 24.4*  PROT 5.3* 5.7*  ALBUMIN 2.3* 2.4*    Recent Labs  05/29/13 0500 05/30/13 0540  WBC 7.4 7.4  HGB 13.9 14.6  HCT 39.7 41.7  MCV 79.6 81.0  PLT 240 260    Recent Labs  05/30/13 0805 05/31/13 0533  LABPROT 16.6* 16.3*  INR 1.38 1.34      Assessment/Plan: Jaundice due to acute hepatitis from drugs and also has Hep C. Needs continued supportive care. Patient subsequently discharged from hospital by Int Med. Defer hernia treatment to outpt but no acute need for surgery for that at this time. Patient seemed upset when I told her that her drug use is contributing to her hepatitis.   Vicki Conrad C. 05/31/2013, 1:29 PM

## 2013-06-02 LAB — HEPATITIS C GENOTYPE

## 2013-12-03 ENCOUNTER — Emergency Department (HOSPITAL_COMMUNITY): Payer: Medicaid Other

## 2013-12-03 ENCOUNTER — Emergency Department (HOSPITAL_COMMUNITY)
Admission: EM | Admit: 2013-12-03 | Discharge: 2013-12-03 | Disposition: A | Discharge status: Home / Self Care | Payer: Medicaid Other | Attending: Emergency Medicine | Admitting: Emergency Medicine

## 2013-12-03 ENCOUNTER — Encounter (HOSPITAL_COMMUNITY): Payer: Self-pay | Admitting: Emergency Medicine

## 2013-12-03 DIAGNOSIS — Z8739 Personal history of other diseases of the musculoskeletal system and connective tissue: Secondary | ICD-10-CM | POA: Insufficient documentation

## 2013-12-03 DIAGNOSIS — K2990 Gastroduodenitis, unspecified, without bleeding: Principal | ICD-10-CM

## 2013-12-03 DIAGNOSIS — Z79899 Other long term (current) drug therapy: Secondary | ICD-10-CM | POA: Insufficient documentation

## 2013-12-03 DIAGNOSIS — Z8619 Personal history of other infectious and parasitic diseases: Secondary | ICD-10-CM | POA: Insufficient documentation

## 2013-12-03 DIAGNOSIS — R109 Unspecified abdominal pain: Secondary | ICD-10-CM

## 2013-12-03 DIAGNOSIS — K2970 Gastritis, unspecified, without bleeding: Secondary | ICD-10-CM | POA: Insufficient documentation

## 2013-12-03 DIAGNOSIS — F17200 Nicotine dependence, unspecified, uncomplicated: Secondary | ICD-10-CM | POA: Insufficient documentation

## 2013-12-03 DIAGNOSIS — Z3202 Encounter for pregnancy test, result negative: Secondary | ICD-10-CM | POA: Insufficient documentation

## 2013-12-03 HISTORY — DX: Unspecified viral hepatitis C without hepatic coma: B19.20

## 2013-12-03 LAB — URINALYSIS, ROUTINE W REFLEX MICROSCOPIC
GLUCOSE, UA: NEGATIVE mg/dL
Hgb urine dipstick: NEGATIVE
Ketones, ur: NEGATIVE mg/dL
LEUKOCYTES UA: NEGATIVE
Nitrite: NEGATIVE
SPECIFIC GRAVITY, URINE: 1.028 (ref 1.005–1.030)
Urobilinogen, UA: 1 mg/dL (ref 0.0–1.0)
pH: 8.5 — ABNORMAL HIGH (ref 5.0–8.0)

## 2013-12-03 LAB — CBC WITH DIFFERENTIAL/PLATELET
BASOS ABS: 0 10*3/uL (ref 0.0–0.1)
BASOS PCT: 0 % (ref 0–1)
EOS ABS: 0 10*3/uL (ref 0.0–0.7)
EOS PCT: 0 % (ref 0–5)
HCT: 47.9 % — ABNORMAL HIGH (ref 36.0–46.0)
Hemoglobin: 17.3 g/dL — ABNORMAL HIGH (ref 12.0–15.0)
Lymphocytes Relative: 13 % (ref 12–46)
Lymphs Abs: 2 10*3/uL (ref 0.7–4.0)
MCH: 29.7 pg (ref 26.0–34.0)
MCHC: 36.1 g/dL — AB (ref 30.0–36.0)
MCV: 82.2 fL (ref 78.0–100.0)
Monocytes Absolute: 0.4 10*3/uL (ref 0.1–1.0)
Monocytes Relative: 3 % (ref 3–12)
NEUTROS PCT: 85 % — AB (ref 43–77)
Neutro Abs: 13.4 10*3/uL — ABNORMAL HIGH (ref 1.7–7.7)
PLATELETS: 263 10*3/uL (ref 150–400)
RBC: 5.83 MIL/uL — ABNORMAL HIGH (ref 3.87–5.11)
RDW: 13.5 % (ref 11.5–15.5)
WBC: 15.9 10*3/uL — ABNORMAL HIGH (ref 4.0–10.5)

## 2013-12-03 LAB — COMPREHENSIVE METABOLIC PANEL
ALBUMIN: 3.9 g/dL (ref 3.5–5.2)
ALT: 20 U/L (ref 0–35)
AST: 19 U/L (ref 0–37)
Alkaline Phosphatase: 88 U/L (ref 39–117)
BUN: 9 mg/dL (ref 6–23)
CALCIUM: 10.1 mg/dL (ref 8.4–10.5)
CO2: 24 mEq/L (ref 19–32)
Chloride: 102 mEq/L (ref 96–112)
Creatinine, Ser: 0.7 mg/dL (ref 0.50–1.10)
GFR calc non Af Amer: >90 mL/min (ref >90)
GLUCOSE: 122 mg/dL — AB (ref 70–99)
POTASSIUM: 4.3 meq/L (ref 3.7–5.3)
SODIUM: 141 meq/L (ref 137–147)
TOTAL PROTEIN: 8.4 g/dL — AB (ref 6.0–8.3)
Total Bilirubin: 0.4 mg/dL (ref 0.3–1.2)

## 2013-12-03 LAB — URINE MICROSCOPIC-ADD ON

## 2013-12-03 LAB — LIPASE, BLOOD: Lipase: 26 U/L (ref 11–59)

## 2013-12-03 LAB — POC URINE PREG, ED: Preg Test, Ur: NEGATIVE

## 2013-12-03 LAB — PREGNANCY, URINE: PREG TEST UR: NEGATIVE

## 2013-12-03 MED ORDER — MORPHINE SULFATE 4 MG/ML IJ SOLN
4.0000 mg | Freq: Once | INTRAMUSCULAR | Status: AC
  Administered 2013-12-03: 4 mg via INTRAVENOUS
  Filled 2013-12-03: qty 1

## 2013-12-03 MED ORDER — IOHEXOL 300 MG/ML  SOLN
25.0000 mL | Freq: Once | INTRAMUSCULAR | Status: AC | PRN
  Administered 2013-12-03: 25 mL via ORAL

## 2013-12-03 MED ORDER — HYDROMORPHONE HCL PF 1 MG/ML IJ SOLN
1.0000 mg | Freq: Once | INTRAMUSCULAR | Status: AC
  Administered 2013-12-03: 1 mg via INTRAVENOUS
  Filled 2013-12-03: qty 1

## 2013-12-03 MED ORDER — IOHEXOL 300 MG/ML  SOLN
80.0000 mL | Freq: Once | INTRAMUSCULAR | Status: AC | PRN
  Administered 2013-12-03: 80 mL via INTRAVENOUS

## 2013-12-03 MED ORDER — PROMETHAZINE HCL 25 MG/ML IJ SOLN
12.5000 mg | Freq: Once | INTRAMUSCULAR | Status: AC
  Administered 2013-12-03: 12.5 mg via INTRAVENOUS
  Filled 2013-12-03: qty 1

## 2013-12-03 MED ORDER — ONDANSETRON HCL 4 MG/2ML IJ SOLN
4.0000 mg | Freq: Once | INTRAMUSCULAR | Status: AC
  Administered 2013-12-03: 4 mg via INTRAVENOUS
  Filled 2013-12-03: qty 2

## 2013-12-03 MED ORDER — ONDANSETRON 8 MG PO TBDP: ORAL_TABLET | ORAL | Status: AC

## 2013-12-03 MED ORDER — OXYCODONE HCL 10 MG PO TABS: 10.0000 mg | ORAL_TABLET | Freq: Four times a day (QID) | ORAL | Status: AC | PRN

## 2013-12-03 MED ORDER — SODIUM CHLORIDE 0.9 % IV BOLUS (SEPSIS)
1000.0000 mL | Freq: Once | INTRAVENOUS | Status: AC
  Administered 2013-12-03: 1000 mL via INTRAVENOUS

## 2013-12-03 NOTE — ED Notes (Signed)
Pt c/o upper abd pain, n/v "since august, but worse since last night." states "i have a bad gallbladder and liver they told me. She is a&ox4, resp e/u

## 2013-12-03 NOTE — ED Provider Notes (Signed)
CSN: [id number]     Arrival date & time 12/03/13  1039 History   First MD Initiated Contact with Patient 12/03/13 1115     Chief Complaint  Patient presents with  . Abdominal Pain     (Consider location/radiation/quality/duration/timing/severity/associated sxs/prior Treatment) HPI Comments: Patient is a 38 year old female with history of hepatitis C, IV drug abuse, gallbladder disease. She presents today with complaints of upper abdominal pain that has been ongoing for many months but became worse yesterday. She states that her pain is severe and crampy. She denies any fevers. She denies any vomiting or bloody stool. She has a history of IV drug abuse and does admit to using heroin within the past 2 days. She also has been smoking marijuana.  Patient is a 38 y.o. female presenting with abdominal pain. The history is provided by the patient.  Abdominal Pain Pain location:  Epigastric Pain quality: cramping   Pain radiates to:  Does not radiate Pain severity:  Severe Onset quality:  Sudden Duration:  2 days Timing:  Constant Progression:  Worsening Chronicity:  Recurrent Relieved by:  Nothing Worsened by:  Nothing tried Ineffective treatments:  None tried   Past Medical History  Diagnosis Date  . Liver disease     Diagnosed in 2014, no work-up to date  . Scoliosis     Per patient report, with chronic back pain, on chronic opioids  . Tobacco abuse   . IV drug abuse   . Hepatitis C    Past Surgical History  Procedure Laterality Date  . Cesarean section     History reviewed. No pertinent family history. History  Substance Use Topics  . Smoking status: Current Every Day Smoker -- 1.00 packs/day for 25 years    Types: Cigarettes  . Smokeless tobacco: Never Used  . Alcohol Use: No     Comment: History of binge drinking, quit in 2002 when she became pregnant   OB History   Grav Para Term Preterm Abortions TAB SAB Ect Mult Living                 Review of Systems   Gastrointestinal: Positive for abdominal pain.  All other systems reviewed and are negative.      Allergies  Review of patient's allergies indicates no known allergies.  Home Medications   Current Outpatient Rx  Name  Route  Sig  Dispense  Refill  . desogestrel-ethinyl estradiol (KARIVA,AZURETTE,MIRCETTE) 0.15-0.02/0.01 MG (21/5) tablet   Oral   Take 1 tablet by mouth daily.         Marland Kitchen loratadine (CLARITIN) 10 MG tablet   Oral   Take 10 mg by mouth 2 (two) times daily.         Marland Kitchen LORazepam (ATIVAN) 0.5 MG tablet   Oral   Take 0.5 mg by mouth 3 (three) times daily.         Marland Kitchen oxycodone (ROXICODONE) 30 MG immediate release tablet   Oral   Take 30 mg by mouth 3 (three) times daily.          BP 150/111  Pulse 80  Temp(Src) 97.7 F (36.5 C) (Oral)  Resp 16  Wt 120 lb (54.432 kg)  SpO2 100% Physical Exam  Nursing note and vitals reviewed. Constitutional: She is oriented to person, place, and time. She appears well-developed and well-nourished. No distress.  HENT:  Head: Normocephalic and atraumatic.  Mouth/Throat: Oropharynx is clear and moist.  Neck: Normal range of motion. Neck supple.  Cardiovascular:  Normal rate, regular rhythm and normal heart sounds.   No murmur heard. Pulmonary/Chest: Effort normal and breath sounds normal. No respiratory distress. She has no wheezes.  Abdominal: Soft. Bowel sounds are normal. She exhibits no distension. There is tenderness.  There is tenderness to palpation in the epigastric region. There is voluntary guarding but no rebound tenderness.  Musculoskeletal: Normal range of motion. She exhibits no edema.  Neurological: She is alert and oriented to person, place, and time.  Skin: Skin is warm and dry. She is not diaphoretic.    ED Course  Procedures (including critical care time) Labs Review Labs Reviewed  CBC WITH DIFFERENTIAL  COMPREHENSIVE METABOLIC PANEL  LIPASE, BLOOD  URINALYSIS, ROUTINE W REFLEX MICROSCOPIC   PREGNANCY, URINE  POC URINE PREG, ED   Imaging Review No results found.    MDM   Final diagnoses:  None    Patient is a 38 year old female with history of hepatitis C, drug abuse. She presents today with complaints of severe epigastric pain. Workup reveals an elevated white count of 16,000 with normal LFTs and lipase.Marland Kitchen. An ultrasound was performed which revealed no acute abnormalities of the gallbladder or liver. As I had no explanation for her discomfort, a CT scan of the abdomen and pelvis was performed which revealed no acute intra-abdominal pathology with the exception of a  mild thickening of the stomach suggestive of possible gastritis. She was given pain medication, anti-medics, and IV fluid appears to be feeling somewhat better.  Also of note is that this patient admits to recent heroin use, and she is on high doses of narcotic pain medication which she states she ran out of 2 days ago. I suspect there may be some degree of withdrawal. When I suggested this to this patient, she became somewhat angry, argumentative, and accusations towards me of profiling her. She now wants to go home. I will prescribe a limited quantity of oxycodone and she is to followup with her primary Dr. to refill her chronic pain meds. [facility] and I've advised her to keep this appointment as well.    Geoffery Lyonsouglas Ericah Scotto, MD 12/03/13 [phone]

## 2013-12-03 NOTE — Discharge Instructions (Signed)
Oxycodone as prescribed as needed for pain.  Zofran as needed for nausea.  Followup with your gastroenterologist to discuss further workup and return to the ER if you develop worsening pain, high fever, bloody stool, or other new and concerning symptoms.   Abdominal Pain, Adult Many things can cause abdominal pain. Usually, abdominal pain is not caused by a disease and will improve without treatment. It can often be observed and treated at home. Your health care provider will do a physical exam and possibly order blood tests and X-rays to help determine the seriousness of your pain. However, in many cases, more time must pass before a clear cause of the pain can be found. Before that point, your health care provider may not know if you need more testing or further treatment. HOME CARE INSTRUCTIONS  Monitor your abdominal pain for any changes. The following actions may help to alleviate any discomfort you are experiencing:  Only take over-the-counter or prescription medicines as directed by your health care provider.  Do not take laxatives unless directed to do so by your health care provider.  Try a clear liquid diet (broth, tea, or water) as directed by your health care provider. Slowly move to a bland diet as tolerated. SEEK MEDICAL CARE IF:  You have unexplained abdominal pain.  You have abdominal pain associated with nausea or diarrhea.  You have pain when you urinate or have a bowel movement.  You experience abdominal pain that wakes you in the night.  You have abdominal pain that is worsened or improved by eating food.  You have abdominal pain that is worsened with eating fatty foods. SEEK IMMEDIATE MEDICAL CARE IF:   Your pain does not go away within 2 hours.  You have a fever.  You keep throwing up (vomiting).  Your pain is felt only in portions of the abdomen, such as the right side or the left lower portion of the abdomen.  You pass bloody or black tarry  stools. MAKE SURE YOU:  Understand these instructions.   Will watch your condition.   Will get help right away if you are not doing well or get worse.  Document Released: 07/05/2005 Document Revised: 07/16/2013 Document Reviewed: 06/04/2013 [facility] Patient Information 2014 [employer] [place].

## 2014-07-22 IMAGING — CR DG ABDOMEN ACUTE W/ 1V CHEST
3 series · 3 of 3 positions shown · non-contrast
Comparison: 12/15/2012.

CLINICAL DATA: Liver disease, abdominal pain.

ACUTE ABDOMEN SERIES (ABDOMEN 2 VIEW & CHEST 1 VIEW)

[w chest ap]
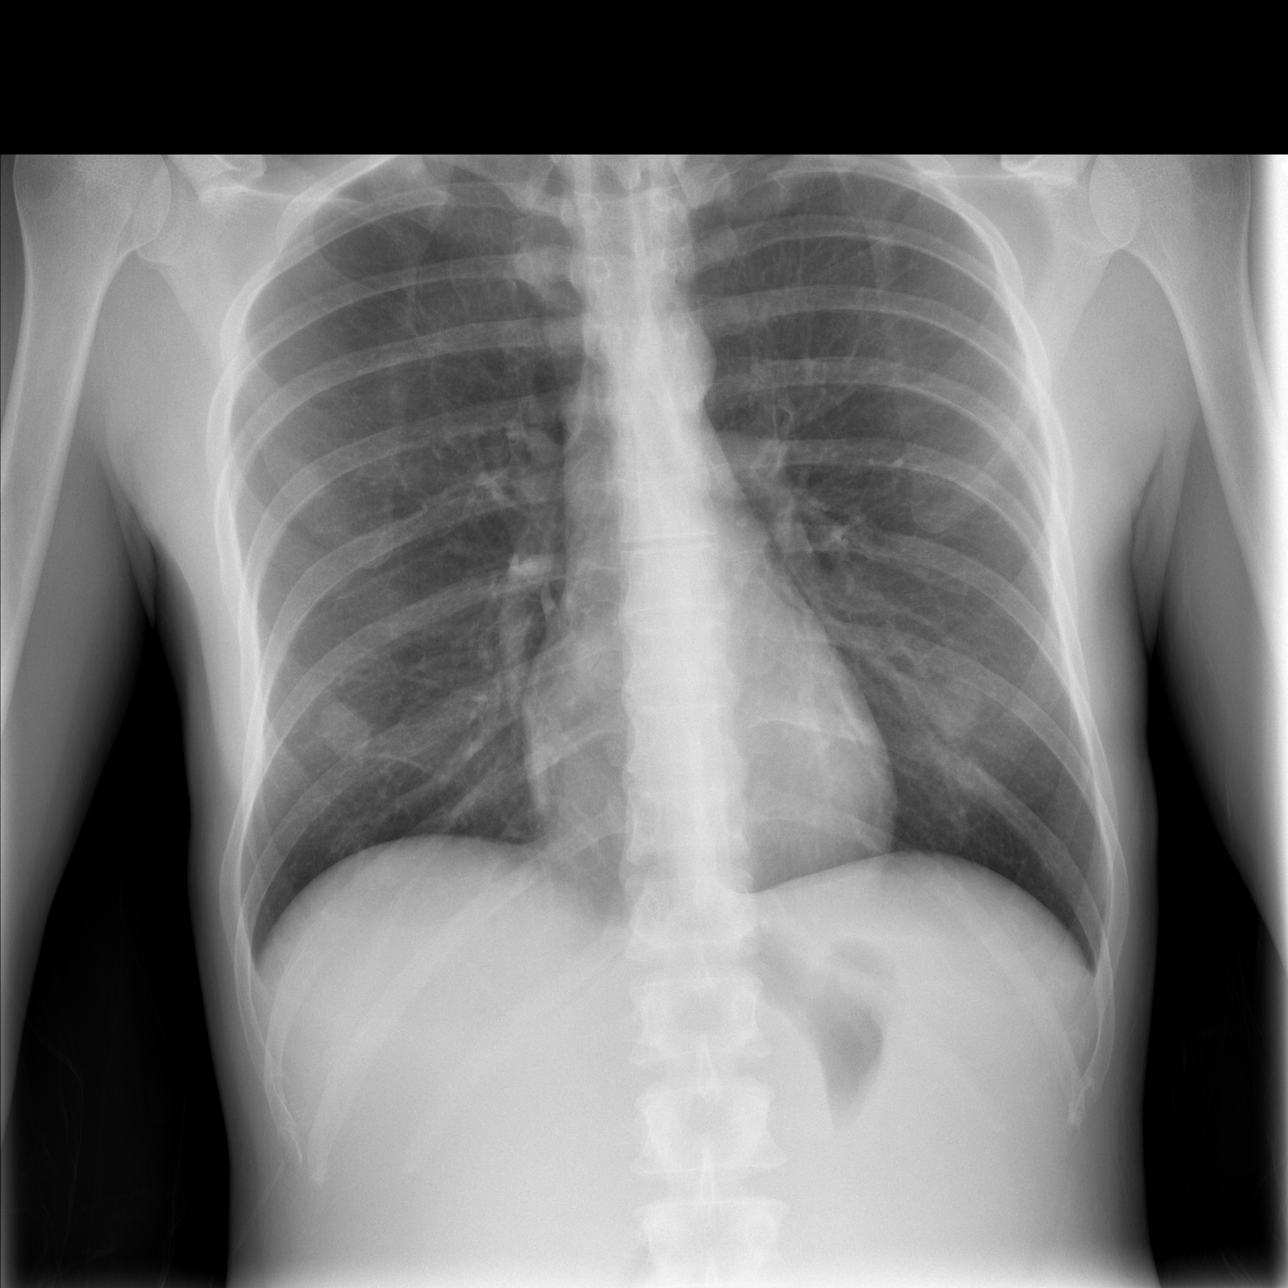

[w abdomen upright]
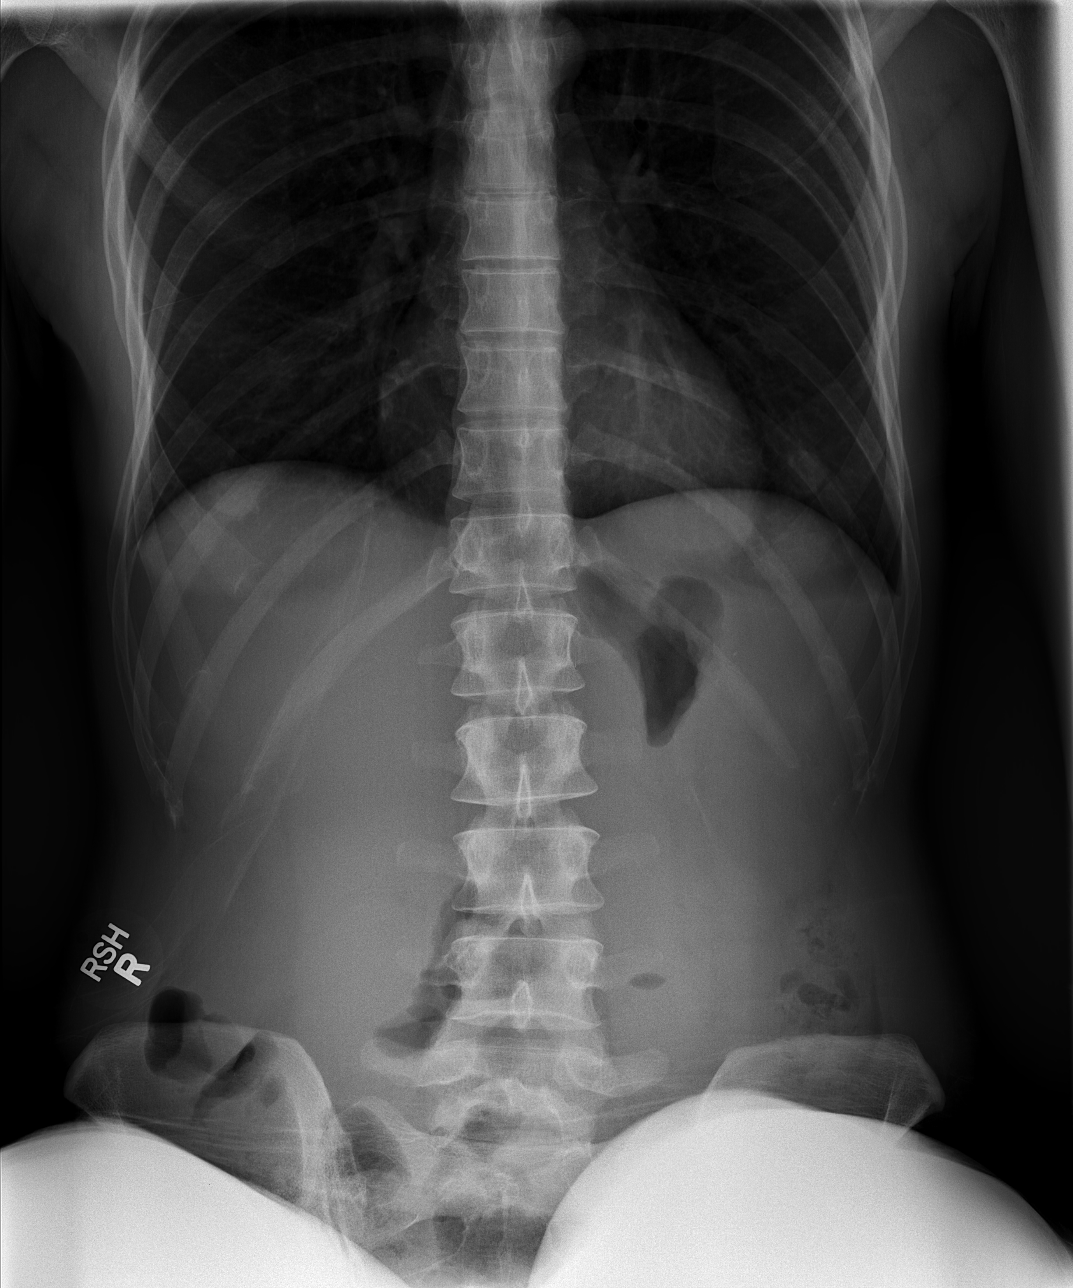

[t abdomen supine]
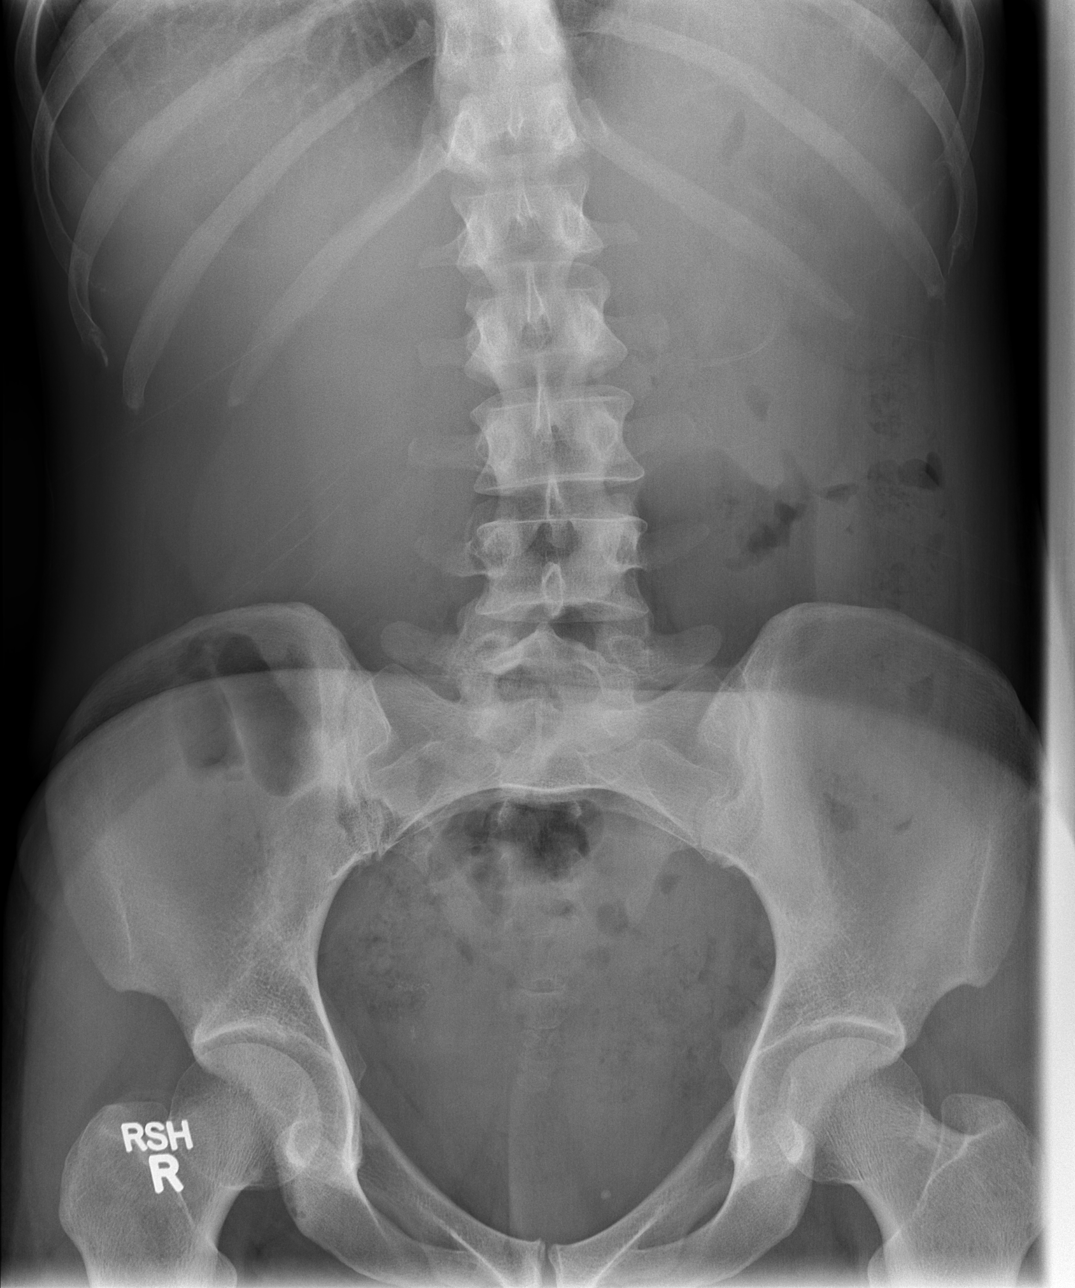

[3 of 3 positions shown; findings below may reference images not displayed]

FINDINGS: Trachea is midline.  Heart size normal.  Nipple shadows
project over the lower hemithoraces.  Lungs are clear.  Question
trace left pleural effusion.

Two views of the abdomen show gas and stool in the colon.  No small
bowel dilatation.  There may be a few scattered air fluid levels.
IMPRESSION: 1.  Bowel gas pattern is somewhat nonspecific.  Constipation is
queried.
2.  Suspect tiny left pleural effusion.

## 2015-01-28 IMAGING — US US ABDOMEN COMPLETE
1 series · 14 of 25 positions shown · non-contrast
Comparison: CT on 05/27/13

CLINICAL DATA: Epigastric pain.  Vomiting.  Chronic Hepatitis-C.

EXAM:
ULTRASOUND ABDOMEN COMPLETE

[Series 1: us abdomen complete · 0.24mm/px · 14 of 61 slices shown]
[im 1/61]
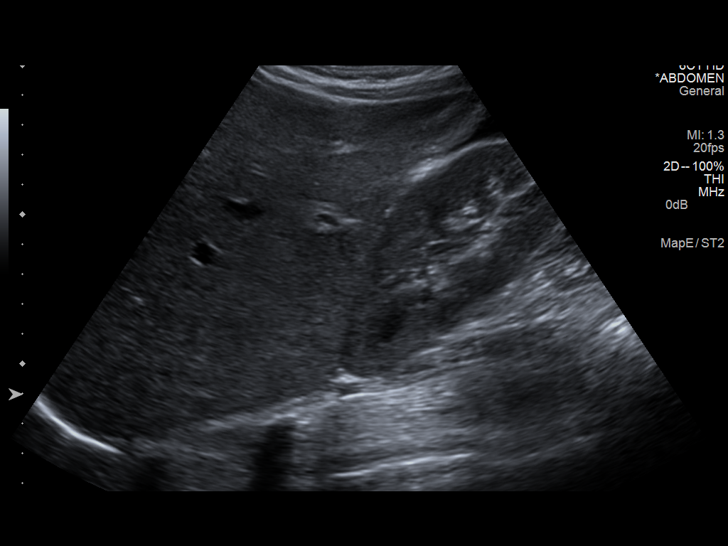
[im 6/61]
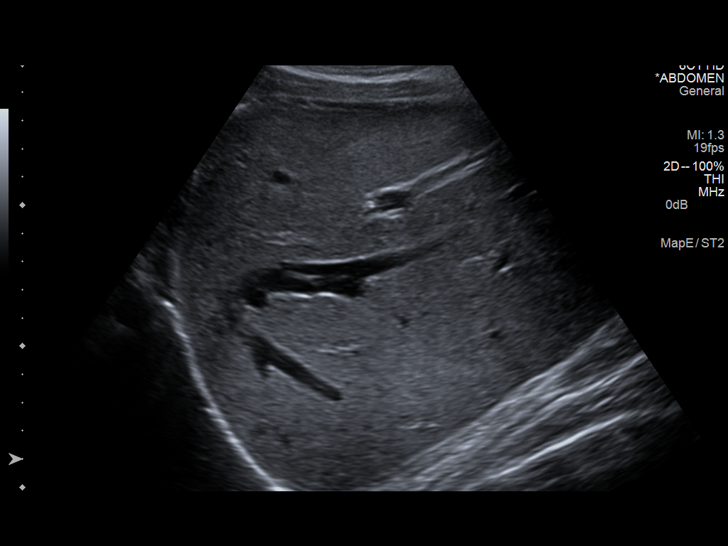
[im 11/61]
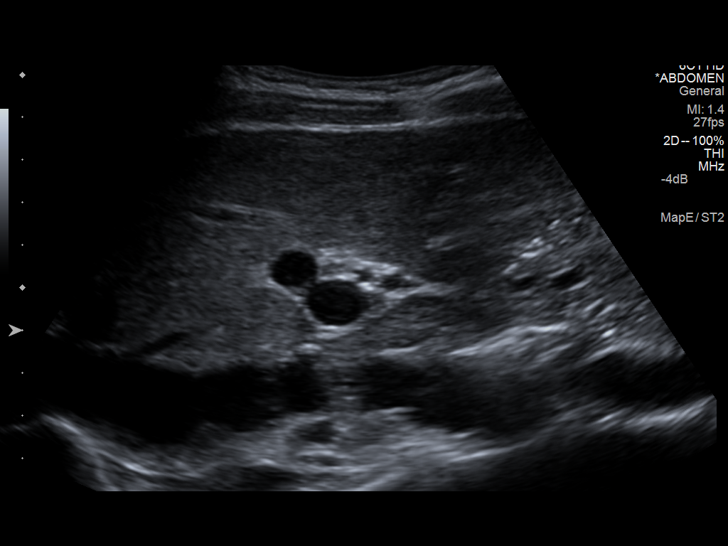
[im 16/61]
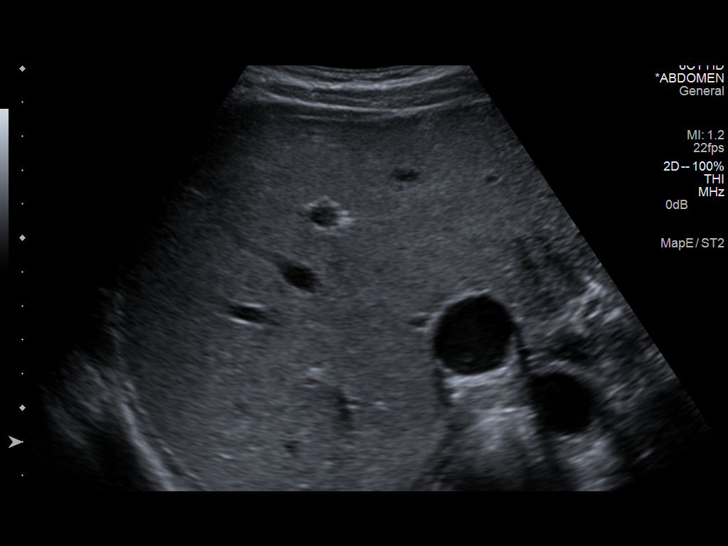
[im 21/61]
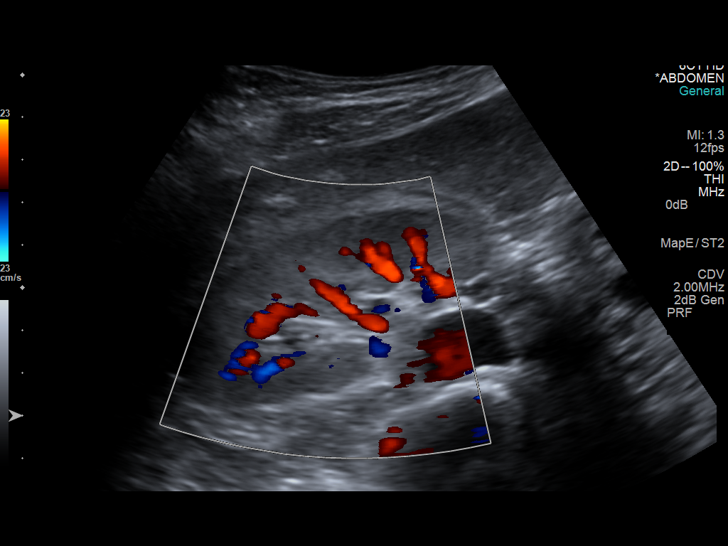
[im 23/61]
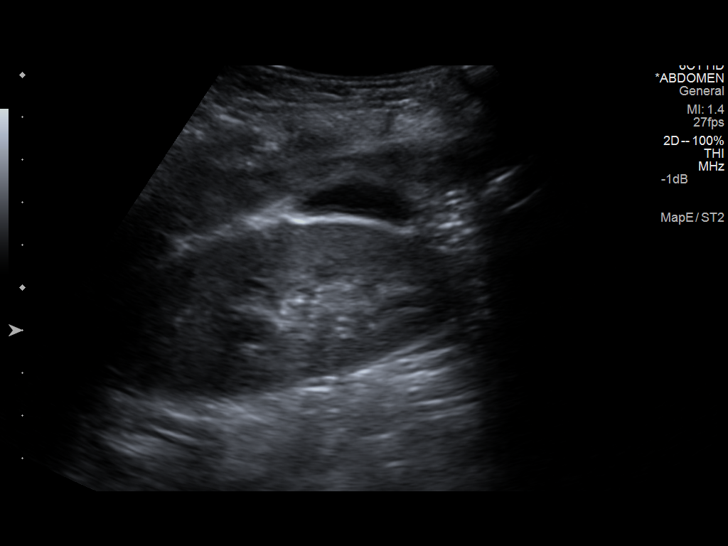
[im 28/61]
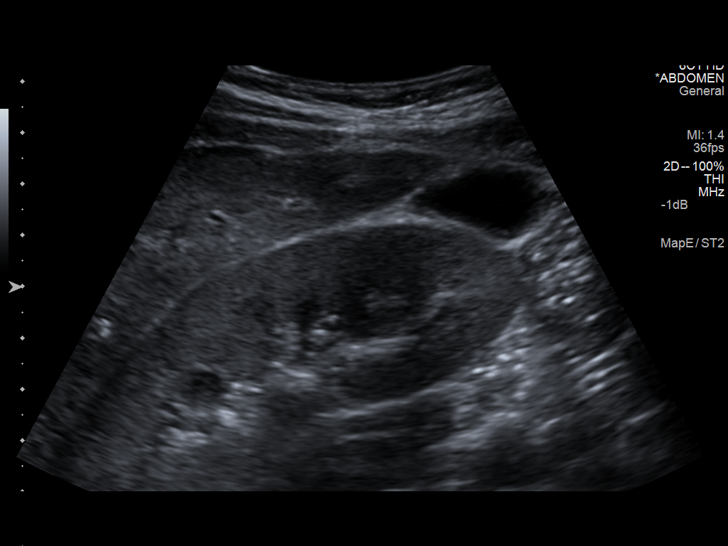
[im 33/61]
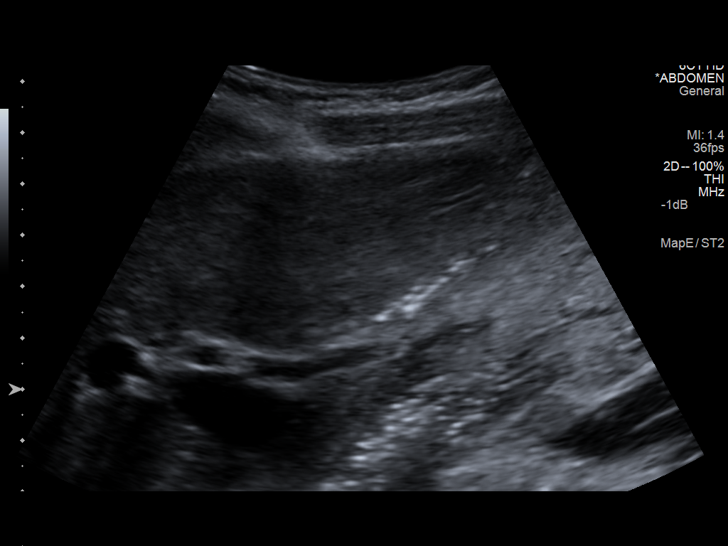
[im 38/61]
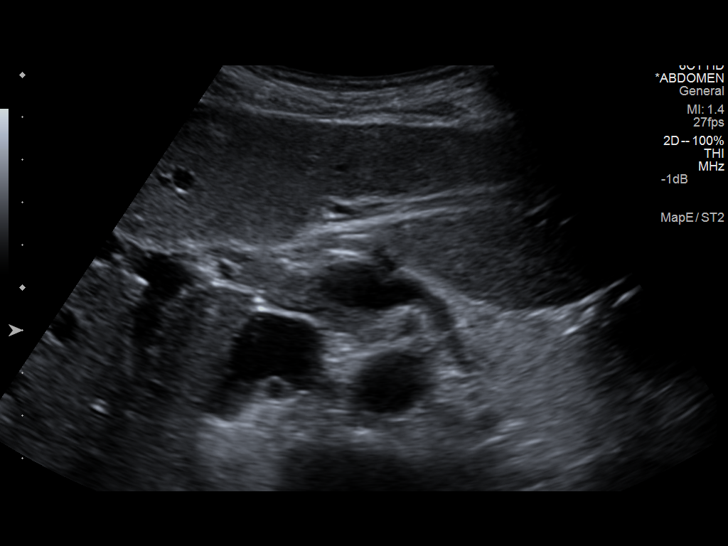
[im 41/61]
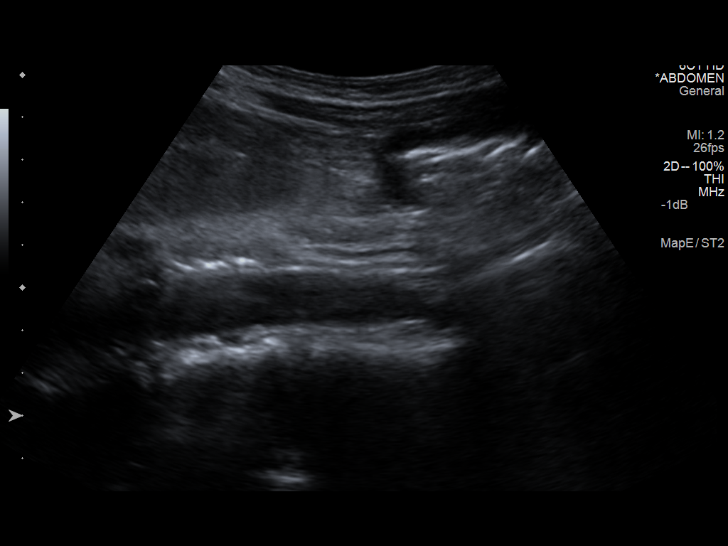
[im 46/61]
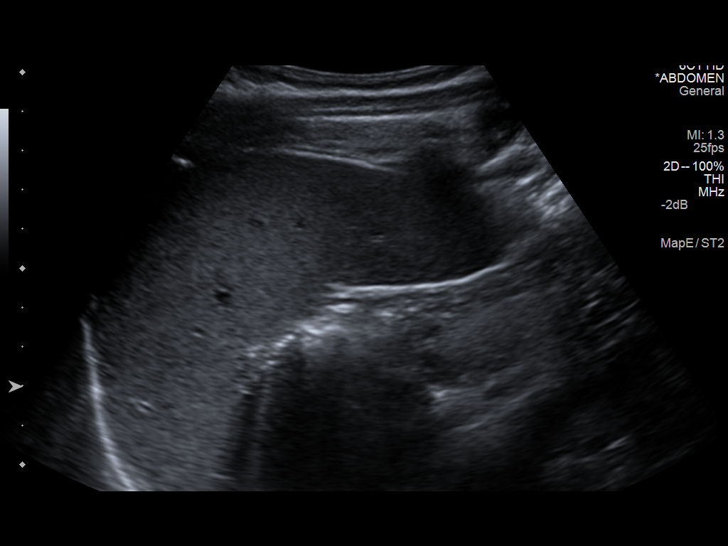
[im 51/61]
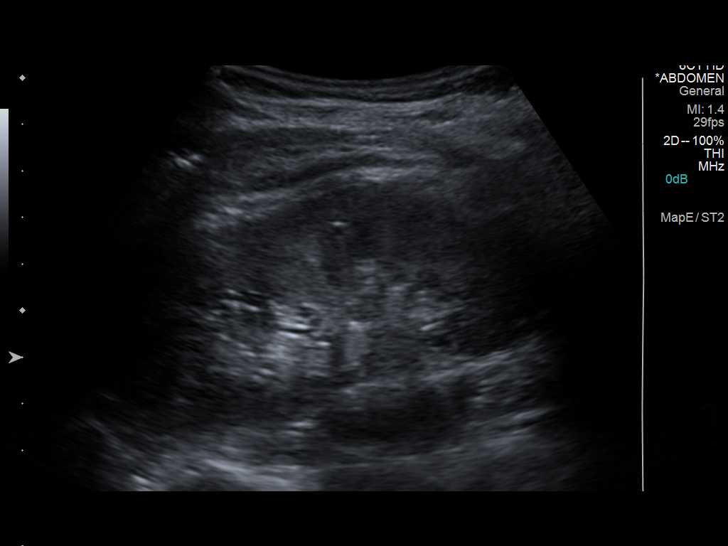
[im 56/61]
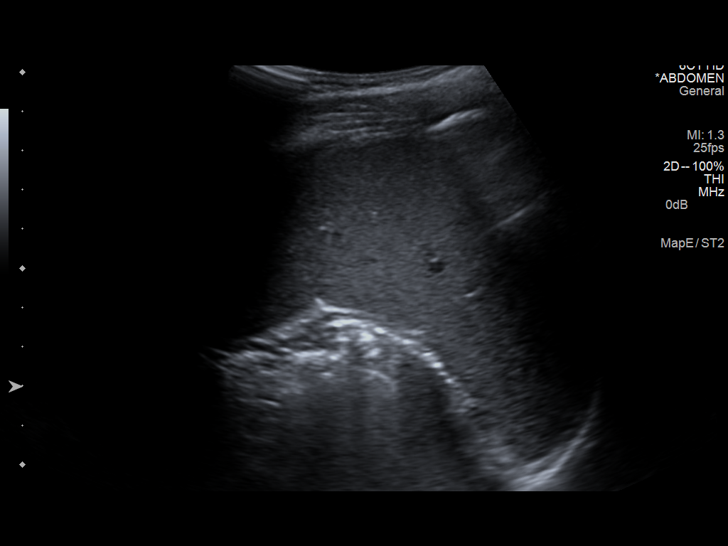
[im 61/61]
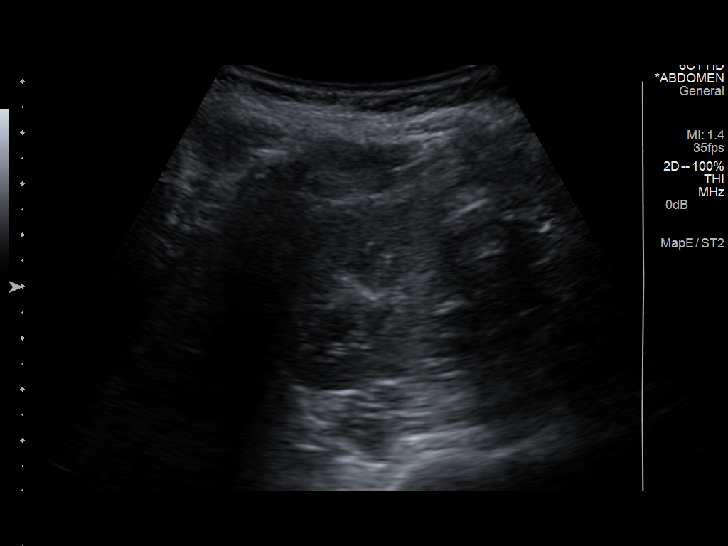

[14 of 25 positions shown; findings below may reference images not displayed]

FINDINGS: Gallbladder:

No gallstones or wall thickening visualized. No sonographic Murphy
sign noted.

Common bile duct:

Diameter: 5 mm

Liver:

No focal lesion identified. Within normal limits in parenchymal
echogenicity.

IVC:

No abnormality visualized.

Pancreas:

Visualized portion unremarkable.

Spleen:

Size and appearance within normal limits.

Right Kidney:

Length: 10.5 cm. Echogenicity within normal limits. No mass or
hydronephrosis visualized.

Left Kidney:

Length: 10.0 cm. Echogenicity within normal limits. No mass or
hydronephrosis visualized.

Abdominal aorta:

No aneurysm visualized.

Other findings:

None.
IMPRESSION: Negative. No evidence of gallstones or other significant
abnormality.
